# Patient Record
Sex: Female | Born: 1980
Health system: Southern US, Community
[De-identification: ages and names within clinical notes are randomized; demographics above are authoritative.]

## PROBLEM LIST (undated history)

## (undated) DIAGNOSIS — Z789 Other specified health status: Secondary | ICD-10-CM

## (undated) HISTORY — DX: Other specified health status: Z78.9

## (undated) HISTORY — PX: NO PAST SURGERIES: SHX2092

---

## 2015-06-25 LAB — OB RESULTS CONSOLE ABO/RH: RH Type: POSITIVE

## 2015-06-25 LAB — OB RESULTS CONSOLE ANTIBODY SCREEN: Antibody Screen: NEGATIVE

## 2015-06-25 LAB — OB RESULTS CONSOLE HEPATITIS B SURFACE ANTIGEN: HEP B S AG: NEGATIVE

## 2015-06-25 LAB — OB RESULTS CONSOLE HIV ANTIBODY (ROUTINE TESTING): HIV: NONREACTIVE

## 2015-07-16 ENCOUNTER — Other Ambulatory Visit (HOSPITAL_COMMUNITY)
Admission: RE | Admit: 2015-07-16 | Discharge: 2015-07-16 | Disposition: A | Discharge status: Home / Self Care | Payer: Federal, State, Local not specified - PPO | Source: Ambulatory Visit | Attending: Obstetrics & Gynecology | Admitting: Obstetrics & Gynecology

## 2015-07-16 DIAGNOSIS — Z01419 Encounter for gynecological examination (general) (routine) without abnormal findings: Secondary | ICD-10-CM | POA: Diagnosis not present

## 2015-07-16 DIAGNOSIS — Z113 Encounter for screening for infections with a predominantly sexual mode of transmission: Secondary | ICD-10-CM | POA: Diagnosis present

## 2015-07-16 DIAGNOSIS — Z1151 Encounter for screening for human papillomavirus (HPV): Secondary | ICD-10-CM | POA: Diagnosis present

## 2015-07-16 LAB — OB RESULTS CONSOLE RUBELLA ANTIBODY, IGM: RUBELLA: IMMUNE

## 2015-07-16 LAB — OB RESULTS CONSOLE GC/CHLAMYDIA
CHLAMYDIA, DNA PROBE: NEGATIVE
GC PROBE AMP, GENITAL: NEGATIVE

## 2015-11-10 LAB — OB RESULTS CONSOLE RPR: RPR: NONREACTIVE

## 2016-01-13 LAB — OB RESULTS CONSOLE GBS: GBS: POSITIVE

## 2016-01-15 NOTE — H&P (Signed)
HPI: 35 y/o G1P0 @ [redacted]w[redacted]d estimated gestational age who presents for External cephalic version due to breech presentation.  US performed at [redacted]w[redacted]d confirmed frank breech presentation.  She reports no contractions, vaginal bleeding or loss of fluid.  +Fetal movement  ROS: no HA, no epigastric pain, no visual changes.    Pregnancy complicated by: 1) h/o asthma- uses inhaler as needed, denies regular use 2) Breech presentation: [place] @ 362w2d- frank breech (spine maternal left), posterior, EFW: 6#2oz (61%)   Prenatal Transfer Tool  Maternal Diabetes: No Genetic Screening: Normal Maternal Ultrasounds/Referrals: Normal Fetal Ultrasounds or other Referrals:  None Maternal Substance Abuse:  No Significant Maternal Medications:  None Significant Maternal Lab Results: Lab values include: Group B Strep positive   PNL:  GBS POSITIVE, Rub Immune, Hep B neg, RPR NR, HIV neg, GC/C neg, glucola:109 Hgb: 11.5 Blood type: O positive, antibody neg  OBHx: primip PMHx:  asthma Meds:  PNV, albuterol inhaler prn Allergy:  Allergies not on file SurgHx: none SocHx:   no Tobacco, no  EtOH, no Illicit Drugs  O: Examination performed in office on 01/12/15 Gen. AAOx3, NAD Abd. Gravid, non-tender Extr.  no edema B/L , no calf tenderness  FHT:  145bpm  Labs: see orders  A/P:  35 y.o. G1P0 @ [redacted]w[redacted]d EGA who presents for ECV due to breech presentation -FWB:  Reassuring by doppler, plan for continuous monitoring upon arrival  -Plan for bedside ultrasound immediately prior to procedure -Pt to receive Terbutaline 0.2mg  IM prior to start of procedure -Risk/benefit/indications reviewed with patient.  Concerns and questions were addressed and pt wishes to proceed with procedure. -Following procedure will plan on at least 1hr of fetal monitoring.   Myna Hidalgo, DO [phone] (pager) [phone] (office)

## 2016-01-16 ENCOUNTER — Observation Stay (HOSPITAL_COMMUNITY)
Admission: AD | Admit: 2016-01-16 | Discharge: 2016-01-16 | Disposition: A | Discharge status: Home / Self Care | Payer: Federal, State, Local not specified - PPO | Source: Ambulatory Visit | Attending: Obstetrics & Gynecology | Admitting: Obstetrics & Gynecology

## 2016-01-16 ENCOUNTER — Encounter (HOSPITAL_COMMUNITY): Payer: Self-pay | Admitting: *Deleted

## 2016-01-16 DIAGNOSIS — O09513 Supervision of elderly primigravida, third trimester: Secondary | ICD-10-CM | POA: Diagnosis not present

## 2016-01-16 DIAGNOSIS — O321XX0 Maternal care for breech presentation, not applicable or unspecified: Secondary | ICD-10-CM | POA: Diagnosis present

## 2016-01-16 DIAGNOSIS — J45909 Unspecified asthma, uncomplicated: Secondary | ICD-10-CM | POA: Diagnosis not present

## 2016-01-16 DIAGNOSIS — O99513 Diseases of the respiratory system complicating pregnancy, third trimester: Secondary | ICD-10-CM | POA: Insufficient documentation

## 2016-01-16 DIAGNOSIS — Z3A36 36 weeks gestation of pregnancy: Secondary | ICD-10-CM | POA: Diagnosis not present

## 2016-01-16 LAB — ABO/RH: ABO/RH(D): O POS

## 2016-01-16 LAB — CBC
HCT: 38.5 % (ref 36.0–46.0)
Hemoglobin: 13.3 g/dL (ref 12.0–15.0)
MCH: 31.6 pg (ref 26.0–34.0)
MCHC: 34.5 g/dL (ref 30.0–36.0)
MCV: 91.4 fL (ref 78.0–100.0)
PLATELETS: 205 10*3/uL (ref 150–400)
RBC: 4.21 MIL/uL (ref 3.87–5.11)
RDW: 13 % (ref 11.5–15.5)
WBC: 11.6 10*3/uL — AB (ref 4.0–10.5)

## 2016-01-16 LAB — TYPE AND SCREEN
ABO/RH(D): O POS
Antibody Screen: NEGATIVE

## 2016-01-16 MED ORDER — TERBUTALINE SULFATE 1 MG/ML IJ SOLN
INTRAMUSCULAR | Status: AC
  Filled 2016-01-16: qty 1

## 2016-01-16 MED ORDER — TERBUTALINE SULFATE 1 MG/ML IJ SOLN
0.2500 mg | Freq: Once | INTRAMUSCULAR | Status: AC
  Administered 2016-01-16: 0.25 mg via SUBCUTANEOUS

## 2016-01-16 MED ORDER — LACTATED RINGERS IV SOLN
INTRAVENOUS | Status: DC
  Administered 2016-01-16: 11:00:00 via INTRAVENOUS

## 2016-01-16 NOTE — Discharge Instructions (Signed)
°  Labor precautions: Signs and symptoms of labor include:   Menstrual-like cramps, abdominal pain, or back pain.  Uterine contractions that are regular, as frequent as six in an hour, regardless of their intensity (may be mild or painful).  Contractions that start on the top of the uterus and spread down to the lower abdomen and back.   A sense of increased pelvic pressure.   A watery or bloody mucus discharge that comes from the vagina.  WHAT SHOULD YOU DO IF YOU THINK YOU ARE IN LABOR? Call your health care provider right away. You will need to go to the hospital to get checked immediately.

## 2016-01-16 NOTE — Procedures (Signed)
Procedure reviewed and patient consented.  Bedside ultrasound: Homero Fellers breech presentation  Fetal tracings: Category 1   ++++++++++++++++++++++++++++++++++++++++++++++++++++++++++++++++  Procedure:  Medication before procedure: IM Terbutaline given  Number of attempts: 2  Ultrasound during procedure: Yes.    Procedure: After Terbutaline was given and increase in maternal heart rate was noted, a backward roll was attempted.  US performed, fetus remained breech, FHT 120.  A second attempt was performed in the opposite direction with minimal to no movement of fetal hips out of the maternal pelvis.  Result: Failed version  Post-Version fetal tracings: Cat. I  Plan: follow-up in office as scheduled, labor precautions given, fetal kick count reviewed.  Plan for scheduled C-section @ 39wks.  Myna Hidalgo, DO [phone] (pager) [phone] (office)

## 2016-01-28 ENCOUNTER — Inpatient Hospital Stay (HOSPITAL_COMMUNITY)
Admission: AD | Admit: 2016-01-28 | Discharge: 2016-01-31 | DRG: 766 | Disposition: A | Discharge status: Home / Self Care | Payer: Federal, State, Local not specified - PPO | Source: Ambulatory Visit | Attending: Obstetrics & Gynecology | Admitting: Obstetrics & Gynecology

## 2016-01-28 ENCOUNTER — Inpatient Hospital Stay (HOSPITAL_COMMUNITY): Payer: Federal, State, Local not specified - PPO | Admitting: Certified Registered Nurse Anesthetist

## 2016-01-28 ENCOUNTER — Encounter (HOSPITAL_COMMUNITY): Payer: Self-pay | Admitting: *Deleted

## 2016-01-28 ENCOUNTER — Encounter (HOSPITAL_COMMUNITY)
Admission: AD | Disposition: A | Discharge status: Home / Self Care | Payer: Self-pay | Source: Ambulatory Visit | Attending: Obstetrics & Gynecology

## 2016-01-28 DIAGNOSIS — O9952 Diseases of the respiratory system complicating childbirth: Secondary | ICD-10-CM | POA: Diagnosis present

## 2016-01-28 DIAGNOSIS — O99824 Streptococcus B carrier state complicating childbirth: Secondary | ICD-10-CM | POA: Diagnosis present

## 2016-01-28 DIAGNOSIS — O321XX0 Maternal care for breech presentation, not applicable or unspecified: Secondary | ICD-10-CM | POA: Diagnosis present

## 2016-01-28 DIAGNOSIS — Z3A38 38 weeks gestation of pregnancy: Secondary | ICD-10-CM

## 2016-01-28 DIAGNOSIS — Z3490 Encounter for supervision of normal pregnancy, unspecified, unspecified trimester: Secondary | ICD-10-CM

## 2016-01-28 DIAGNOSIS — J45909 Unspecified asthma, uncomplicated: Secondary | ICD-10-CM | POA: Diagnosis present

## 2016-01-28 LAB — TYPE AND SCREEN
ABO/RH(D): O POS
ANTIBODY SCREEN: NEGATIVE

## 2016-01-28 LAB — CBC
HCT: 37.8 % (ref 36.0–46.0)
HEMOGLOBIN: 13.1 g/dL (ref 12.0–15.0)
MCH: 31.3 pg (ref 26.0–34.0)
MCHC: 34.7 g/dL (ref 30.0–36.0)
MCV: 90.2 fL (ref 78.0–100.0)
PLATELETS: 189 10*3/uL (ref 150–400)
RBC: 4.19 MIL/uL (ref 3.87–5.11)
RDW: 13.1 % (ref 11.5–15.5)
WBC: 12.9 10*3/uL — ABNORMAL HIGH (ref 4.0–10.5)

## 2016-01-28 LAB — RPR: RPR: NONREACTIVE

## 2016-01-28 SURGERY — Surgical Case
Anesthesia: Spinal | Site: Abdomen

## 2016-01-28 MED ORDER — SENNOSIDES-DOCUSATE SODIUM 8.6-50 MG PO TABS
2.0000 | ORAL_TABLET | ORAL | Status: DC
  Administered 2016-01-29: 2 via ORAL
  Filled 2016-01-28 (×2): qty 2

## 2016-01-28 MED ORDER — FENTANYL CITRATE (PF) 100 MCG/2ML IJ SOLN
INTRAMUSCULAR | Status: AC
  Filled 2016-01-28: qty 2

## 2016-01-28 MED ORDER — ACETAMINOPHEN 500 MG PO TABS
1000.0000 mg | ORAL_TABLET | Freq: Four times a day (QID) | ORAL | Status: AC
  Administered 2016-01-29: 1000 mg via ORAL
  Filled 2016-01-28 (×2): qty 2

## 2016-01-28 MED ORDER — DIPHENHYDRAMINE HCL 25 MG PO CAPS: 25.0000 mg | ORAL_CAPSULE | Freq: Four times a day (QID) | ORAL | Status: DC | PRN

## 2016-01-28 MED ORDER — ONDANSETRON HCL 4 MG/2ML IJ SOLN
INTRAMUSCULAR | Status: DC | PRN
  Administered 2016-01-28: 4 mg via INTRAVENOUS

## 2016-01-28 MED ORDER — ACETAMINOPHEN 325 MG PO TABS: 650.0000 mg | ORAL_TABLET | ORAL | Status: DC | PRN

## 2016-01-28 MED ORDER — SIMETHICONE 80 MG PO CHEW: 80.0000 mg | CHEWABLE_TABLET | ORAL | Status: DC | PRN

## 2016-01-28 MED ORDER — FAMOTIDINE IN NACL 20-0.9 MG/50ML-% IV SOLN
20.0000 mg | Freq: Once | INTRAVENOUS | Status: AC
  Administered 2016-01-28: 20 mg via INTRAVENOUS

## 2016-01-28 MED ORDER — OXYCODONE-ACETAMINOPHEN 5-325 MG PO TABS: 2.0000 | ORAL_TABLET | ORAL | Status: DC | PRN

## 2016-01-28 MED ORDER — IBUPROFEN 600 MG PO TABS
600.0000 mg | ORAL_TABLET | Freq: Four times a day (QID) | ORAL | Status: DC
  Administered 2016-01-28 – 2016-01-31 (×9): 600 mg via ORAL
  Filled 2016-01-28 (×11): qty 1

## 2016-01-28 MED ORDER — DIPHENHYDRAMINE HCL 50 MG/ML IJ SOLN: 12.5000 mg | INTRAMUSCULAR | Status: DC | PRN

## 2016-01-28 MED ORDER — OXYCODONE-ACETAMINOPHEN 5-325 MG PO TABS
1.0000 | ORAL_TABLET | ORAL | Status: DC | PRN
  Administered 2016-01-29: 1 via ORAL
  Filled 2016-01-28: qty 1

## 2016-01-28 MED ORDER — FENTANYL CITRATE (PF) 100 MCG/2ML IJ SOLN
INTRAMUSCULAR | Status: DC | PRN
  Administered 2016-01-28: 15 ug via EPIDURAL

## 2016-01-28 MED ORDER — SCOPOLAMINE 1 MG/3DAYS TD PT72
MEDICATED_PATCH | TRANSDERMAL | Status: DC | PRN
  Administered 2016-01-28: 1 via TRANSDERMAL

## 2016-01-28 MED ORDER — CITRIC ACID-SODIUM CITRATE 334-500 MG/5ML PO SOLN
ORAL | Status: AC
  Administered 2016-01-28: 30 mL via ORAL
  Filled 2016-01-28: qty 15

## 2016-01-28 MED ORDER — LACTATED RINGERS IV BOLUS (SEPSIS)
1000.0000 mL | Freq: Once | INTRAVENOUS | Status: AC
  Administered 2016-01-28: 1000 mL via INTRAVENOUS

## 2016-01-28 MED ORDER — BUPIVACAINE IN DEXTROSE 0.75-8.25 % IT SOLN
INTRATHECAL | Status: DC | PRN
  Administered 2016-01-28: 1.4 mL via INTRATHECAL
  Administered 2016-01-28: 1 mL via INTRATHECAL

## 2016-01-28 MED ORDER — LANOLIN HYDROUS EX OINT: 1.0000 "application " | TOPICAL_OINTMENT | CUTANEOUS | Status: DC | PRN

## 2016-01-28 MED ORDER — PHENYLEPHRINE 8 MG IN D5W 100 ML (0.08MG/ML) PREMIX OPTIME
INJECTION | INTRAVENOUS | Status: DC | PRN
  Administered 2016-01-28: 60 ug/min via INTRAVENOUS

## 2016-01-28 MED ORDER — MEPERIDINE HCL 25 MG/ML IJ SOLN: 6.2500 mg | INTRAMUSCULAR | Status: DC | PRN

## 2016-01-28 MED ORDER — ONDANSETRON HCL 4 MG/2ML IJ SOLN
INTRAMUSCULAR | Status: AC
  Filled 2016-01-28: qty 2

## 2016-01-28 MED ORDER — MORPHINE SULFATE (PF) 0.5 MG/ML IJ SOLN
INTRAMUSCULAR | Status: DC | PRN
  Administered 2016-01-28: .2 mg via EPIDURAL

## 2016-01-28 MED ORDER — NALOXONE HCL 2 MG/2ML IJ SOSY
1.0000 ug/kg/h | PREFILLED_SYRINGE | INTRAMUSCULAR | Status: DC | PRN
  Filled 2016-01-28: qty 2

## 2016-01-28 MED ORDER — FAMOTIDINE IN NACL 20-0.9 MG/50ML-% IV SOLN
INTRAVENOUS | Status: AC
  Administered 2016-01-28: 20 mg via INTRAVENOUS
  Filled 2016-01-28: qty 50

## 2016-01-28 MED ORDER — WITCH HAZEL-GLYCERIN EX PADS: 1.0000 "application " | MEDICATED_PAD | CUTANEOUS | Status: DC | PRN

## 2016-01-28 MED ORDER — DIBUCAINE 1 % RE OINT: 1.0000 "application " | TOPICAL_OINTMENT | RECTAL | Status: DC | PRN

## 2016-01-28 MED ORDER — LACTATED RINGERS IV SOLN
INTRAVENOUS | Status: DC | PRN
  Administered 2016-01-28: 08:00:00 via INTRAVENOUS

## 2016-01-28 MED ORDER — SODIUM CHLORIDE 0.9% FLUSH: 3.0000 mL | INTRAVENOUS | Status: DC | PRN

## 2016-01-28 MED ORDER — ZOLPIDEM TARTRATE 5 MG PO TABS: 5.0000 mg | ORAL_TABLET | Freq: Every evening | ORAL | Status: DC | PRN

## 2016-01-28 MED ORDER — NALOXONE HCL 0.4 MG/ML IJ SOLN: 0.4000 mg | INTRAMUSCULAR | Status: DC | PRN

## 2016-01-28 MED ORDER — OXYTOCIN 10 UNIT/ML IJ SOLN
INTRAMUSCULAR | Status: AC
  Filled 2016-01-28: qty 4

## 2016-01-28 MED ORDER — DIPHENHYDRAMINE HCL 25 MG PO CAPS: 25.0000 mg | ORAL_CAPSULE | ORAL | Status: DC | PRN

## 2016-01-28 MED ORDER — KETOROLAC TROMETHAMINE 30 MG/ML IJ SOLN: 30.0000 mg | Freq: Once | INTRAMUSCULAR | Status: DC

## 2016-01-28 MED ORDER — IBUPROFEN 600 MG PO TABS: 600.0000 mg | ORAL_TABLET | Freq: Four times a day (QID) | ORAL | Status: DC | PRN

## 2016-01-28 MED ORDER — SCOPOLAMINE 1 MG/3DAYS TD PT72
MEDICATED_PATCH | TRANSDERMAL | Status: AC
  Filled 2016-01-28: qty 1

## 2016-01-28 MED ORDER — NALBUPHINE HCL 10 MG/ML IJ SOLN: 5.0000 mg | Freq: Once | INTRAMUSCULAR | Status: DC | PRN

## 2016-01-28 MED ORDER — CEFAZOLIN SODIUM-DEXTROSE 2-3 GM-% IV SOLR
2.0000 g | INTRAVENOUS | Status: AC
  Administered 2016-01-28: 2 g via INTRAVENOUS
  Filled 2016-01-28: qty 50

## 2016-01-28 MED ORDER — SIMETHICONE 80 MG PO CHEW
80.0000 mg | CHEWABLE_TABLET | Freq: Three times a day (TID) | ORAL | Status: DC
  Administered 2016-01-28 – 2016-01-31 (×7): 80 mg via ORAL
  Filled 2016-01-28 (×7): qty 1

## 2016-01-28 MED ORDER — MENTHOL 3 MG MT LOZG: 1.0000 | LOZENGE | OROMUCOSAL | Status: DC | PRN

## 2016-01-28 MED ORDER — NALBUPHINE HCL 10 MG/ML IJ SOLN: 5.0000 mg | INTRAMUSCULAR | Status: DC | PRN

## 2016-01-28 MED ORDER — KETOROLAC TROMETHAMINE 30 MG/ML IJ SOLN
30.0000 mg | Freq: Four times a day (QID) | INTRAMUSCULAR | Status: DC | PRN
  Administered 2016-01-28: 30 mg via INTRAMUSCULAR

## 2016-01-28 MED ORDER — KETOROLAC TROMETHAMINE 30 MG/ML IJ SOLN
INTRAMUSCULAR | Status: AC
Start: 2016-01-28 — End: 2016-01-28
  Filled 2016-01-28: qty 1

## 2016-01-28 MED ORDER — PHENYLEPHRINE 8 MG IN D5W 100 ML (0.08MG/ML) PREMIX OPTIME
INJECTION | INTRAVENOUS | Status: AC
  Filled 2016-01-28: qty 100

## 2016-01-28 MED ORDER — PROMETHAZINE HCL 25 MG/ML IJ SOLN: 6.2500 mg | INTRAMUSCULAR | Status: DC | PRN

## 2016-01-28 MED ORDER — SCOPOLAMINE 1 MG/3DAYS TD PT72: 1.0000 | MEDICATED_PATCH | Freq: Once | TRANSDERMAL | Status: DC

## 2016-01-28 MED ORDER — OXYTOCIN 10 UNIT/ML IJ SOLN: 2.5000 [IU]/h | INTRAVENOUS | Status: AC

## 2016-01-28 MED ORDER — LACTATED RINGERS IV SOLN
INTRAVENOUS | Status: DC
  Administered 2016-01-28 (×2): via INTRAVENOUS

## 2016-01-28 MED ORDER — CITRIC ACID-SODIUM CITRATE 334-500 MG/5ML PO SOLN
30.0000 mL | Freq: Once | ORAL | Status: AC
  Administered 2016-01-28: 30 mL via ORAL

## 2016-01-28 MED ORDER — SIMETHICONE 80 MG PO CHEW
80.0000 mg | CHEWABLE_TABLET | ORAL | Status: DC
Start: 2016-01-29 — End: 2016-01-31
  Administered 2016-01-29 – 2016-01-31 (×2): 80 mg via ORAL
  Filled 2016-01-28 (×2): qty 1

## 2016-01-28 MED ORDER — KETOROLAC TROMETHAMINE 30 MG/ML IJ SOLN: 30.0000 mg | Freq: Four times a day (QID) | INTRAMUSCULAR | Status: DC | PRN

## 2016-01-28 MED ORDER — ONDANSETRON HCL 4 MG/2ML IJ SOLN: 4.0000 mg | Freq: Three times a day (TID) | INTRAMUSCULAR | Status: DC | PRN

## 2016-01-28 MED ORDER — MORPHINE SULFATE (PF) 0.5 MG/ML IJ SOLN
INTRAMUSCULAR | Status: AC
  Filled 2016-01-28: qty 10

## 2016-01-28 MED ORDER — OXYTOCIN 10 UNIT/ML IJ SOLN
40.0000 [IU] | INTRAVENOUS | Status: DC | PRN
  Administered 2016-01-28: 40 [IU] via INTRAVENOUS

## 2016-01-28 MED ORDER — LACTATED RINGERS IV SOLN
INTRAVENOUS | Status: DC
  Administered 2016-01-28 (×2): via INTRAVENOUS

## 2016-01-28 MED ORDER — PRENATAL MULTIVITAMIN CH
1.0000 | ORAL_TABLET | Freq: Every day | ORAL | Status: DC
  Administered 2016-01-30: 1 via ORAL
  Filled 2016-01-28 (×2): qty 1

## 2016-01-28 SURGICAL SUPPLY — 28 items
BARRIER ADHS 3X4 INTERCEED (GAUZE/BANDAGES/DRESSINGS) ×3 IMPLANT
BENZOIN TINCTURE PRP APPL 2/3 (GAUZE/BANDAGES/DRESSINGS) ×3 IMPLANT
CLAMP CORD UMBIL (MISCELLANEOUS) ×3 IMPLANT
CLOSURE STERI STRIP 1/2 X4 (GAUZE/BANDAGES/DRESSINGS) ×3 IMPLANT
CLOTH BEACON ORANGE TIMEOUT ST (SAFETY) ×3 IMPLANT
DRAPE SHEET LG 3/4 BI-LAMINATE (DRAPES) ×3 IMPLANT
DRSG OPSITE POSTOP 4X10 (GAUZE/BANDAGES/DRESSINGS) ×3 IMPLANT
ELECT REM PT RETURN 9FT ADLT (ELECTROSURGICAL) ×3
ELECTRODE REM PT RTRN 9FT ADLT (ELECTROSURGICAL) ×1 IMPLANT
GLOVE BIOGEL PI IND STRL 6.5 (GLOVE) ×1 IMPLANT
GLOVE BIOGEL PI INDICATOR 6.5 (GLOVE) ×2
GLOVE ECLIPSE 6.5 STRL STRAW (GLOVE) ×3 IMPLANT
GOWN STRL REUS W/TWL LRG LVL3 (GOWN DISPOSABLE) ×9 IMPLANT
NS IRRIG 1000ML POUR BTL (IV SOLUTION) ×3 IMPLANT
PACK C SECTION WH (CUSTOM PROCEDURE TRAY) ×3 IMPLANT
PAD ABD 7.5X8 STRL (GAUZE/BANDAGES/DRESSINGS) ×3 IMPLANT
PAD OB MATERNITY 4.3X12.25 (PERSONAL CARE ITEMS) ×3 IMPLANT
RTRCTR C-SECT PINK 25CM LRG (MISCELLANEOUS) ×3 IMPLANT
SPONGE GAUZE 4X4 12PLY STER LF (GAUZE/BANDAGES/DRESSINGS) ×6 IMPLANT
SUT VIC AB 0 CT1 27 (SUTURE) ×4
SUT VIC AB 0 CT1 27XBRD ANBCTR (SUTURE) ×2 IMPLANT
SUT VIC AB 0 CTX 36 (SUTURE) ×6
SUT VIC AB 0 CTX36XBRD ANBCTRL (SUTURE) ×3 IMPLANT
SUT VIC AB 2-0 CT1 27 (SUTURE) ×2
SUT VIC AB 2-0 CT1 TAPERPNT 27 (SUTURE) ×1 IMPLANT
SUT VIC AB 4-0 KS 27 (SUTURE) ×3 IMPLANT
TOWEL OR 17X24 6PK STRL BLUE (TOWEL DISPOSABLE) ×6 IMPLANT
TRAY FOLEY CATH SILVER 14FR (SET/KITS/TRAYS/PACK) ×3 IMPLANT

## 2016-01-28 NOTE — H&P (Signed)
35 y/o G1P0 @ [redacted]w[redacted]d estimated gestational age who presents for active labor with breech presentation.  Pt states her water broke around 430am and started having more frequent contractions.  No vaginal bleeding. +FM ROS: no HA, no epigastric pain, no visual changes.   Pregnancy complicated by: 1) h/o asthma- uses inhaler as needed, denies regular use 2) Breech presentation: [place] @ [redacted]w[redacted]d- frank breech (spine maternal left), posterior, EFW: 6#2oz (61%)  Prenatal Transfer Tool  Maternal Diabetes: No Genetic Screening: Normal Maternal Ultrasounds/Referrals: Normal Fetal Ultrasounds or other Referrals: None Maternal Substance Abuse: No Significant Maternal Medications: None Significant Maternal Lab Results: Lab values include: Group B Strep positive   PNL: GBS POSITIVE, Rub Immune, Hep B neg, RPR NR, HIV neg, GC/C neg, glucola:109 Hgb: 11.5 Blood type: O positive, antibody neg       OBHx: primip PMHx: asthma Meds: PNV, albuterol inhaler prn Allergy: Allergies not on file SurgHx: none SocHx: no Tobacco, no EtOH, no Illicit Drugs  O: BP 125/82 mmHg  Pulse 77  Temp(Src) 98.2 F (36.8 C) (Oral)  Resp 16  Ht 5' 8.5" (1.74 m)  Wt 77.111 kg (170 lb)  BMI 25.47 kg/m2  SpO2 99%  Gen. AAOx3, NAD Abd. Gravid, non-tender Extr. no edema B/L , no calf tenderness  FHT: 130,  Moderate variability, +accels, no decels Toco: q 3-39min SVE: per RN 6cm, frank breech BSUS: Confirm breech presentation  Labs: see orders  A/P: 35 y.o. G1P0 @ [redacted]w[redacted]d EGA who presents in active labor, plan for primary C-section. -FWB: Cat. I -NPO -LR @ 125cc/hr -Ancef 2g IV to OR -Risk/benefit and indications reviewed with patient- questions and concerns were addressed.  Pt wishes to proceed with C-section.  Inform consent obtained.  Myna Hidalgo, DO [phone] (pager) [phone] (office)

## 2016-01-28 NOTE — Consult Note (Signed)
Neonatology Note:   Attendance at C-section:   I was asked by Dr. Ozan to attend this primary C/S at term due to breech presentation in labor. The mother is a G1P0 O pos, GBS pos with an otherwis uncomplicated pregnancy. ROM 3.5 hours prior to delivery, fluid clear. Infant vigorous with good spontaneous cry and tone. Needed bulb suctioning several times for clear secretions. Ap 8/9. Lungs clear to ausc in DR. To CN to care of Pediatrician.  Yesenia Kelsay C. Yesenia Spielberg, MD 

## 2016-01-28 NOTE — Op Note (Signed)
PreOp Diagnosis: Breech presentation, active labor PostOp Diagnosis: same Procedure: Primary Csection Surgeon: Dr. Myna Hidalgo Assistant: Georges Lynch Anesthesia: spinal Complications: none EBL: 500cc UOP: 100cc Fluids: 2400cc  Findings: Homero Fellers breech presentation, normal uterus, tubes and ovaries bilaterally.  PROCEDURE:  Informed consent was obtained from the patient with risks, benefits, complications, treatment options, and expected outcomes discussed with the patient.  The patient concurred with the proposed plan, giving informed consent with form signed.   The patient was taken to Operating Room, and identified with the procedure verified as C-Section Delivery with Time Out. With induction of anesthesia, the patient was prepped and draped in the usual sterile fashion. A Pfannenstiel incision was made and carried down through the subcutaneous tissue to the fascia. The fascia was incised in the midline and extended transversely. The superior aspect of the fascial incision was grasped with Kochers elevated and the underlying muscle dissected off. The inferior aspect of the facial incision was in similar fashion, grasped elevated and rectus muscles dissected off. The peritoneum was identified and entered. Peritoneal incision was extended longitudinally. The utero-vesical peritoneal reflection was identified and incised transversely with the [facility] scissors, the incision extended laterally, the bladder flap created digitally. A low transverse uterine incision was made and frank breech was noted.  The pelvis was delivered, each leg was then flexed and delivered.  A blue towel was placed over the pelvis, the baby was rotated to allow for delivery of each arm.  The head was flexed and delivered without difficulty.. After the umbilical cord was clamped and cut cord blood was obtained for evaluation.   The placenta was removed intact and appeared normal. The uterine outline, tubes and ovaries appeared  normal. The uterine incision was closed with running locked sutures of 0 Vicryl and a second layer of the same stitch was used in an imbricating fashion.  Excellent hemostasis was obtained.  The pericolic gutters were then cleared of all clots and debris. Interceed was placed over the hysterotomy. The fascia was then reapproximated with running sutures of 0 Vicryl. The peritoneum was closed in a running fashion.  The skin was closed with 4-0 vicryl in a subcuticular fashion.  Instrument, sponge, and needle counts were correct prior the abdominal closure and at the conclusion of the case. The patient was taken to recovery in stable condition.  Myna Hidalgo, DO [phone] (pager) [phone] (office)

## 2016-01-28 NOTE — Lactation Note (Signed)
This note was copied from a baby's chart. Lactation Consultation Note Initial visit at 10 hours of age.  Mom reports a few good feedings but she had needed help getting baby latch.  Mom is sitting in chair recovering from c/s.  Baby is swaddled in FOBs arms asleep.  Mom reports a feeding of 45 minutes with baby falling asleep some.  Encouraged mom to stimulate baby to stay awake during feeding.  LC assisted with instructions on hand expression.  Right nipple has large bruise possible due to compression stripe.  Mom describes a shallow latch.  Discussed latching for a deep latch and mom will call for assist with next feeding.  Questions answered and discussed basics at length.  [facility] LC resources given and discussed.  Encouraged to feed with early cues on demand.  Early newborn behavior discussed.  Hand expression demonstrated by mom  with colostrum visible and rubbed onto nipple.  Mom to call for assist as needed.     Patient Name: Yesenia Haney [id number] Date: 01/28/2016 Reason for consult: Initial assessment   Maternal Data Has patient been taught Hand Expression?: Yes Does the patient have breastfeeding experience prior to this delivery?: No  Feeding Feeding Type: Breast Fed Length of feed: 45 min  LATCH Score/Interventions Latch: Grasps breast easily, tongue down, lips flanged, rhythmical sucking.  Audible Swallowing: A few with stimulation Intervention(s): Skin to skin  Type of Nipple: Everted at rest and after stimulation  Comfort (Breast/Nipple): Soft / non-tender     Hold (Positioning): Assistance needed to correctly position infant at breast and maintain latch. Intervention(s): Breastfeeding basics reviewed;Support Pillows;Skin to skin;Position options  LATCH Score: 8  Lactation Tools Discussed/Used     Consult Status Consult Status: Follow-up Date: 01/29/16 Follow-up type: In-patient    Jannifer Rodney 01/28/2016, 6:03 PM

## 2016-01-28 NOTE — MAU Note (Signed)
Pt reports ROM at 0400, contractions.

## 2016-01-28 NOTE — Addendum Note (Signed)
Addendum  created 01/28/16 1334 by Jhonnie Garner, CRNA   Modules edited: Clinical Notes   Clinical Notes:  File: [id number]

## 2016-01-28 NOTE — Transfer of Care (Signed)
Immediate Anesthesia Transfer of Care Note  Patient: Yesenia Haney  Procedure(s) Performed: Procedure(s): CESAREAN SECTION (N/A)  Patient Location: PACU  Anesthesia Type:Spinal  Level of Consciousness: awake, alert  and oriented  Airway & Oxygen Therapy: Patient Spontanous Breathing  Post-op Assessment: Report given to RN and Post -op Vital signs reviewed and stable  Post vital signs: Reviewed and stable  Last Vitals:  Filed Vitals:   01/28/16 0634  BP: 125/82  Pulse: 77  Temp: 36.8 C  Resp: 16    Complications: No apparent anesthesia complications

## 2016-01-28 NOTE — Anesthesia Procedure Notes (Addendum)
Spinal Patient location during procedure: OR Start time: 01/28/2016 7:25 AM End time: 01/28/2016 7:28 AM Staffing Anesthesiologist: Leilani Able Performed by: anesthesiologist  Preanesthetic Checklist Completed: patient identified, surgical consent, pre-op evaluation, timeout performed, IV checked, risks and benefits discussed and monitors and equipment checked Spinal Block Patient position: sitting Prep: site prepped and draped and DuraPrep Patient monitoring: heart rate, cardiac monitor, continuous pulse ox and blood pressure Approach: midline Location: L3-4 Injection technique: single-shot Needle Needle type: Sprotte  Needle gauge: 24 G Needle length: 9 cm Assessment Sensory level: T12  Spinal Patient location during procedure: OR Start time: 01/28/2016 7:35 AM End time: 01/28/2016 7:40 AM Staffing Anesthesiologist: Leilani Able Performed by: anesthesiologist  Preanesthetic Checklist Completed: patient identified, surgical consent, pre-op evaluation, timeout performed, IV checked, risks and benefits discussed and monitors and equipment checked Spinal Block Patient position: left lateral decubitus Prep: site prepped and draped and DuraPrep Patient monitoring: heart rate, cardiac monitor, continuous pulse ox and blood pressure Approach: midline Location: L3-4 Injection technique: single-shot Needle Needle type: Sprotte  Needle gauge: 24 G Needle length: 9 cm Needle insertion depth: 5 cm Assessment Sensory level: T4 Additional Notes Patient needed second spinal because of inadequate level for surgery especially on the left side.

## 2016-01-28 NOTE — Anesthesia Preprocedure Evaluation (Signed)
Anesthesia Evaluation  Patient identified by MRN, date of birth, ID band Patient awake    Reviewed: Allergy & Precautions, H&P , NPO status , Patient's Chart, lab work & pertinent test results  Airway Mallampati: I  TM Distance: >3 FB Neck ROM: full    Dental no notable dental hx.    Pulmonary neg pulmonary ROS,    Pulmonary exam normal        Cardiovascular negative cardio ROS Normal cardiovascular exam     Neuro/Psych negative neurological ROS  negative psych ROS   GI/Hepatic negative GI ROS, Neg liver ROS,   Endo/Other  negative endocrine ROS  Renal/GU negative Renal ROS     Musculoskeletal   Abdominal Normal abdominal exam  (+)   Peds  Hematology negative hematology ROS (+)   Anesthesia Other Findings   Reproductive/Obstetrics (+) Pregnancy                             Anesthesia Physical Anesthesia Plan  ASA: II  Anesthesia Plan: Spinal   Post-op Pain Management:    Induction:   Airway Management Planned:   Additional Equipment:   Intra-op Plan:   Post-operative Plan:   Informed Consent: I have reviewed the patients History and Physical, chart, labs and discussed the procedure including the risks, benefits and alternatives for the proposed anesthesia with the patient or authorized representative who has indicated his/her understanding and acceptance.     Plan Discussed with: CRNA and Surgeon  Anesthesia Plan Comments:         Anesthesia Quick Evaluation  

## 2016-01-28 NOTE — Lactation Note (Signed)
This note was copied from a baby's chart. Lactation Consultation Note Follow up visit at 12 hours of age.  MBU RN reports mom was tearful and unable to tolerate feeding and no colostrum expressed for collection.  MBU RN gave mom #20NS with improved comfort. Mom reports feeding for about 15 minutes.  Instructed mom on unlatching baby.  No colostrum visible in NS at this time. LC fit mom with #24 with improved fit.  Baby latched an additional 10 minutes with vigorous sucking and some audible swallows, but again no colostrum noted in NS.  LC unable to express on left breast after feeding.  Right nipple is bruised and sore, encourage mom to use hand pump and needed with instructions.  MBU RN at bedside for report and will set up DEBP for mom to post pump for stimulation and to offer supplement of EBM if available.  Mom complains of initial latch on pain, but then about "2" with feeding.  Discussed use of NS and need to attempt without and follow up for weight checks.  MBU RN gave mom information sheet on NS use.  Mom to call for assist as needed.  Mom is very tired with eyes closing during visit and FOB went home but will be back soon.  Encouraged mom to rest when she can.      Patient Name: Yesenia Haney [id number] Date: 01/28/2016 Reason for consult: Follow-up assessment;Breast/nipple pain;Difficult latch   Maternal Data Has patient been taught Hand Expression?: Yes Does the patient have breastfeeding experience prior to this delivery?: No  Feeding Feeding Type: Breast Fed Length of feed:  (observed about 10 minutes)  LATCH Score/Interventions Latch: Grasps breast easily, tongue down, lips flanged, rhythmical sucking. Intervention(s): Adjust position;Assist with latch;Breast massage;Breast compression  Audible Swallowing: None (a few audible swallows no colostrum in NS) Intervention(s): Skin to skin;Hand expression;Alternate breast massage  Type of Nipple: Everted at rest and after  stimulation  Comfort (Breast/Nipple): Filling, red/small blisters or bruises, mild/mod discomfort  Problem noted: Mild/Moderate discomfort;Cracked, bleeding, blisters, bruises Interventions  (Cracked/bleeding/bruising/blister): Hand pump Interventions (Mild/moderate discomfort): Pre-pump if needed;Post-pump  Hold (Positioning): Assistance needed to correctly position infant at breast and maintain latch. Intervention(s): Breastfeeding basics reviewed;Support Pillows;Position options;Skin to skin  LATCH Score: 6  Lactation Tools Discussed/Used Tools: Nipple Shields Nipple shield size: 24   Consult Status Consult Status: Follow-up Date: 01/29/16 Follow-up type: In-patient    Yesenia Haney 01/28/2016, 8:05 PM

## 2016-01-28 NOTE — Anesthesia Postprocedure Evaluation (Signed)
Anesthesia Post Note  Patient: Yesenia Haney  Procedure(s) Performed: Procedure(s) (LRB): CESAREAN SECTION (N/A)  Patient location during evaluation: Mother Baby Anesthesia Type: Spinal Level of consciousness: awake and alert and oriented Pain management: pain level controlled Vital Signs Assessment: post-procedure vital signs reviewed and stable Respiratory status: spontaneous breathing Cardiovascular status: blood pressure returned to baseline Postop Assessment: no headache, no backache, spinal receding, patient able to bend at knees, no signs of nausea or vomiting and adequate PO intake Anesthetic complications: no    Last Vitals:  Filed Vitals:   01/28/16 1128 01/28/16 1230  BP: 114/73 109/69  Pulse: 51 49  Temp: 36.6 C 35.6 C  Resp: 16 16    Last Pain:  Filed Vitals:   01/28/16 1249  PainSc: 1                  Kayvan Hoefling

## 2016-01-28 NOTE — Anesthesia Postprocedure Evaluation (Signed)
Anesthesia Post Note  Patient: Yesenia Haney  Procedure(s) Performed: Procedure(s) (LRB): CESAREAN SECTION (N/A)  Patient location during evaluation: PACU Anesthesia Type: Spinal Level of consciousness: awake Pain management: pain level controlled Vital Signs Assessment: post-procedure vital signs reviewed and stable Respiratory status: spontaneous breathing Cardiovascular status: stable Postop Assessment: no headache, no backache, spinal receding, patient able to bend at knees and no signs of nausea or vomiting Anesthetic complications: no    Last Vitals:  Filed Vitals:   01/28/16 1047 01/28/16 1048  BP:    Pulse: 53 52  Temp:    Resp: 12 12    Last Pain:  Filed Vitals:   01/28/16 1050  PainSc: 1                  Brentley Landfair JR,JOHN Elward Nocera

## 2016-01-29 LAB — CBC
HEMATOCRIT: 33 % — AB (ref 36.0–46.0)
HEMOGLOBIN: 11 g/dL — AB (ref 12.0–15.0)
MCH: 30.7 pg (ref 26.0–34.0)
MCHC: 33.3 g/dL (ref 30.0–36.0)
MCV: 92.2 fL (ref 78.0–100.0)
Platelets: 161 10*3/uL (ref 150–400)
RBC: 3.58 MIL/uL — AB (ref 3.87–5.11)
RDW: 13.4 % (ref 11.5–15.5)
WBC: 13.6 10*3/uL — ABNORMAL HIGH (ref 4.0–10.5)

## 2016-01-29 NOTE — Lactation Note (Signed)
This note was copied from a baby's chart. Lactation Consultation Note: Mother has a crack on the Rt nipple and very red tenderness noted bilaterally.  Infant was circumcised and is still sleeping. . Advised mother to rouse infant with STS every 2-3 hours. . Mother has pumped her breast once yesterday. Mother has been using a #20,#24 nipple shield. Mother describes severe pain with breast feeding. Attempt to latch infant with #20 nipple shield. Infant chomped down on the tip of mothers nipple . She complains of pain scale of #7. Mother taught to firm nipples. Assist with latching infant on the bare breast. Infant latched with a pain scale of #3-4 per mother. Infant sustained latch for 25 mins. Mother states that pain subsided some but still uncomfortable. Father taught to adjust infants lower jaw and upper lip for wider gape. Discussed the use of the DEBP every 2-3 hours to stimulate milk volume . Parents informed of supply and demand. Mother advised to hand express and spoon feed infant any amt that she can. When mentioned the use of formula mother declines. Informed her to pump frequently to have volume to give infant. Mother to continue to use the nipple shield as needed. Discussed taking a break from breastfeeding if nipple become too sore to tolerate feeding. Mother receptive to all teaching.  Mother to page for assistance with feeding from staff nurse or lactation consultant.   Patient Name: Boy Collen Hostler [id number] Date: 01/29/2016 Reason for consult: Follow-up assessment   Maternal Data    Feeding Feeding Type: Breast Fed Length of feed: 25 min  LATCH Score/Interventions Latch: Repeated attempts needed to sustain latch, nipple held in mouth throughout feeding, stimulation needed to elicit sucking reflex.  Audible Swallowing: Spontaneous and intermittent  Type of Nipple: Everted at rest and after stimulation  Comfort (Breast/Nipple): Engorged, cracked, bleeding, large blisters, severe  discomfort Problem noted: Cracked, bleeding, blisters, bruises Intervention(s): Expressed breast milk to nipple;Double electric pump  Problem noted: Severe discomfort Interventions  (Cracked/bleeding/bruising/blister): Expressed breast milk to nipple;Double electric pump Interventions (Mild/moderate discomfort): Comfort gels  Hold (Positioning): Assistance needed to correctly position infant at breast and maintain latch. Intervention(s): Support Pillows;Position options;Skin to skin  LATCH Score: 6  Lactation Tools Discussed/Used     Consult Status Consult Status: Follow-up Date: 01/29/16 Follow-up type: In-patient    Stevan Born [facility] 01/29/2016, 4:20 PM

## 2016-01-29 NOTE — Lactation Note (Signed)
This note was copied from a baby's chart. Lactation Consultation Note RN request to see  Mom d/t extremely sore nipples. RN stated that mom cries every time baby latches, but pain eventually eases as baby BF. Mom has #20 NS and #24 NS. Using #24. Appears to large but #20 d/t soreness of nipple. Patient Name: Yesenia Haney [id number] Date: 01/29/2016 Reason for consult: Follow-up assessment   Maternal Data    Feeding    LATCH Score/Interventions       Type of Nipple: Everted at rest and after stimulation (short shaft)  Comfort (Breast/Nipple): Engorged, cracked, bleeding, large blisters, severe discomfort Problem noted: Cracked, bleeding, blisters, bruises  Problem noted: Cracked, bleeding, blisters, bruises;Severe discomfort Interventions (Mild/moderate discomfort): Comfort gels;Breast shields;Hand massage;Hand expression        Lactation Tools Discussed/Used Tools: Nipple Shields;Comfort gels Nipple shield size: 20;24 Breast pump type: Double-Electric Breast Pump   Consult Status Consult Status: Follow-up Date: 01/29/16 Follow-up type: In-patient    Charyl Dancer 01/29/2016, 5:23 AM

## 2016-01-29 NOTE — Progress Notes (Signed)
Postoperative Note Day # 1  S:  Patient resting comfortable in bed.  Pain controlled.  Tolerating general diet. No flatus, no BM.  Lochia minimal to moderate.  Ambulating without difficulty.  She denies n/v/f/c, SOB, or CP.  Pt plans on breastfeeding.  O: Temp:  [96 F (35.6 C)-98.5 F (36.9 C)] 98 F (36.7 C) (02/16 0542) Pulse Rate:  [47-93] 50 (02/16 0542) Resp:  [11-22] 20 (02/16 0542) BP: (92-114)/(48-74) 94/48 mmHg (02/16 0542) SpO2:  [94 %-100 %] 96 % (02/16 0542) Gen: A&Ox3, NAD CV: RRR, no MRG Resp: CTAB Abdomen: soft, NT, ND +BS Uterus: firm, non-tender, below umbilicus Incision: honeycomb dressing in place, Clean & dry Ext: No edema, no calf tenderness bilaterally, SCDs in place  Labs:  CBC Latest Ref Rng 01/29/2016 01/28/2016 01/16/2016  WBC 4.0 - 10.5 K/uL 13.6(H) 12.9(H) 11.6(H)  Hemoglobin 12.0 - 15.0 g/dL 11.0(L) 13.1 13.3  Hematocrit 36.0 - 46.0 % 33.0(L) 37.8 38.5  Platelets 150 - 400 K/uL 161 189 205     A/P: Pt is a 35 y.o. G1P1001 s/p Primary C-section for breech presentation, POD#1  - Pain well controlled -GU: UOP is adequate, pt to void on her own this am -GI: Tolerating general diet -Activity: encouraged sitting up to chair and ambulation as tolerated -Prophylaxis: early ambulation, SCDs while in bed -Labs: stable as above -Plan for baby boy circ later today  DISPO: Continue with routine postoperative care.  Myna Hidalgo, DO [phone] (pager) [phone] (office)

## 2016-01-30 ENCOUNTER — Encounter (HOSPITAL_COMMUNITY): Payer: Self-pay

## 2016-01-30 MED ORDER — IBUPROFEN 600 MG PO TABS: 600.0000 mg | ORAL_TABLET | Freq: Four times a day (QID) | ORAL | Status: DC | PRN

## 2016-01-30 MED ORDER — OXYCODONE-ACETAMINOPHEN 5-325 MG PO TABS: 1.0000 | ORAL_TABLET | ORAL | Status: DC | PRN

## 2016-01-30 MED ORDER — SENNOSIDES-DOCUSATE SODIUM 8.6-50 MG PO TABS: 2.0000 | ORAL_TABLET | ORAL | Status: AC

## 2016-01-30 NOTE — Progress Notes (Signed)
Postoperative Note Day # 2  S:  Patient resting comfortable in bed.  Pain controlled.  Tolerating general diet. + flatus, Small BM.  Lochia moderate.  Ambulating without difficulty and voiding freely.  She denies n/v/f/c, SOB, or CP.  Pt plans on breastfeeding and has been seen by lactation  O: Temp:  [97.2 F (36.2 C)-98.2 F (36.8 C)] 97.2 F (36.2 C) (02/17 0549) Pulse Rate:  [63-69] 63 (02/17 0549) Resp:  [18-19] 19 (02/17 0549) BP: (103-110)/(66-77) 110/77 mmHg (02/17 0549)   Gen: A&Ox3, NAD CV: RRR, no MRG Resp: CTAB Abdomen: soft, NT, ND +BS Uterus: firm, non-tender, below umbilicus Incision: honeycomb dressing in place, Clean & dry Ext: No edema, no calf tenderness bilaterally  Labs:  CBC Latest Ref Rng 01/29/2016 01/28/2016 01/16/2016  WBC 4.0 - 10.5 K/uL 13.6(H) 12.9(H) 11.6(H)  Hemoglobin 12.0 - 15.0 g/dL 11.0(L) 13.1 13.3  Hematocrit 36.0 - 46.0 % 33.0(L) 37.8 38.5  Platelets 150 - 400 K/uL 161 189 205     A/P: Pt is a 35 y.o. G1P1001 s/p Primary C-section for breech presentation, POD#2  - Pain well controlled -GU: voiding freely -GI: Tolerating general diet -Activity: encouraged sitting up to chair and ambulation as tolerated -Prophylaxis: early ambulation, SCDs while in bed -Labs: stable as above -baby boy circ completed  DISPO: Continue with routine postoperative care. Plan for discharge home tomorrow, 01/31/16.  Myna Hidalgo, DO [phone] (pager) [phone] (office)

## 2016-01-30 NOTE — Lactation Note (Signed)
This note was copied from a baby's chart. Lactation Consultation Note  Mother's nipples are sore, cracked and peeling skin. Hand expressed drops before latching. Latched in cross cradle.  Sucks and swallows observed. Provided mother with shells to wear in addition to alternating w/ comfort gels. Encouraged her to keep baby deep on breast. Discussed pumping if too sore to latch if needed. Reviewed engorgement care and monitoring voids/stools. Discussed cluster feeding and milk transition.   Patient Name: Yesenia Haney ZOXClimmie Cronce17/2018 Reason for consult: Follow-up assessment   Maternal Data    Feeding Feeding Type: Breast Fed  LATCH Score/Interventions Latch: Grasps breast easily, tongue down, lips flanged, rhythmical sucking.  Audible Swallowing: Spontaneous and intermittent  Type of Nipple: Everted at rest and after stimulation  Comfort (Breast/Nipple): Engorged, cracked, bleeding, large blisters, severe discomfort Intervention(s): Expressed breast milk to nipple;Double electric pump  Problem noted: Cracked, bleeding, blisters, bruises Interventions  (Cracked/bleeding/bruising/blister): Expressed breast milk to nipple;Double electric pump;Hand pump Interventions (Mild/moderate discomfort): Comfort gels  Hold (Positioning): Assistance needed to correctly position infant at breast and maintain latch.  LATCH Score: 7  Lactation Tools Discussed/Used Tools: Shells;Comfort gels   Consult Status Consult Status: Follow-up Date: 01/31/16 Follow-up type: In-patient    Dahlia Byes [facility] 01/30/2016, 11:15 AM

## 2016-01-30 NOTE — Discharge Instructions (Signed)
Cesarean Delivery, Care After Refer to this sheet in the next few weeks. These instructions provide you with information on caring for yourself after your procedure. Your health care provider may also give you specific instructions. Your treatment has been planned according to current medical practices, but problems sometimes occur. Call your health care provider if you have any problems or questions after you go home. HOME CARE INSTRUCTIONS  Only take over-the-counter or prescription medications as directed by your health care provider.  For pain medication- you may alternate/rotate between Percocet and Motrin as needed.  Be advised that percocet may cause constipation and so you should continue with senakot-s as needed twice daily.  Do not drink alcohol, especially if you are breastfeeding or taking medication to relieve pain.  Do not chew or smoke tobacco.  Continue to use good perineal care. Good perineal care includes:  Wiping your perineum from front to back.  Keeping your perineum clean.  Check your surgical cut (incision) daily for increased redness, drainage, swelling, or separation of skin.  Clean your incision gently with soap and water every day, and then pat it dry. If your health care provider says it is okay, leave the incision uncovered. Use a bandage (dressing) if the incision is draining fluid or appears irritated. If the adhesive strips across the incision do not fall off within 7 days, carefully peel them off.  Hug a pillow when coughing or sneezing until your incision is healed. This helps to relieve pain.  Do not use tampons or douche until your health care provider says it is okay.  Shower, wash your hair, and take tub baths as directed by your health care provider.  Wear a well-fitting bra that provides breast support.  Limit wearing support panties or control-top hose.  Drink enough fluids to keep your urine clear or pale yellow.  Eat high-fiber foods such as  whole grain cereals and breads, brown rice, beans, and fresh fruits and vegetables every day. These foods may help prevent or relieve constipation.  Resume activities such as climbing stairs, driving, lifting, exercising, or traveling as directed by your health care provider.  Talk to your health care provider about resuming sexual activities. This is dependent upon your risk of infection, your rate of healing, and your comfort and desire to resume sexual activity.  Try to have someone help you with your household activities and your newborn for at least a few days after you leave the hospital.  Rest as much as possible. Try to rest or take a nap when your newborn is sleeping.  Increase your activities gradually.  Keep all of your scheduled postpartum appointments. It is very important to keep your scheduled follow-up appointments. At these appointments, your health care provider will be checking to make sure that you are healing physically and emotionally. SEEK MEDICAL CARE IF:   You are passing large clots from your vagina. Save any clots to show your health care provider.  You have a foul smelling discharge from your vagina.  You have trouble urinating.  You are urinating frequently.  You have pain when you urinate.  You have a change in your bowel movements.  You have increasing redness, pain, or swelling near your incision.  You have pus draining from your incision.  Your incision is separating.  You have painful, hard, or reddened breasts.  You have a severe headache.  You have blurred vision or see spots.  You feel sad or depressed.  You have thoughts of  hurting yourself or your newborn.  You have questions about your care, the care of your newborn, or medications.  You are dizzy or light-headed.  You have a rash.  You have pain, redness, or swelling at the site of the removed intravenous access (IV) tube.  You have nausea or vomiting.  You stopped  breastfeeding and have not had a menstrual period within 12 weeks of stopping.  You are not breastfeeding and have not had a menstrual period within 12 weeks of delivery.  You have a fever. SEEK IMMEDIATE MEDICAL CARE IF:  You have persistent pain.  You have chest pain.  You have shortness of breath.  You faint.  You have leg pain.  You have stomach pain.  Your vaginal bleeding saturates 2 or more sanitary pads in 1 hour. MAKE SURE YOU:   Understand these instructions.  Will watch your condition.  Will get help right away if you are not doing well or get worse.   This information is not intended to replace advice given to you by your health care provider. Make sure you discuss any questions you have with your health care provider.   Document Released: 08/21/2002 Document Revised: 12/20/2014 Document Reviewed: 07/26/2012 Elsevier Interactive Patient Education [employer]

## 2016-01-30 NOTE — Patient Instructions (Signed)
Your procedure is scheduled on:  Tuesday, Feb. 21, 2017  Enter through the Main Entrance of [facility] at:  6:00 A.M.  Pick up the phone at the desk and dial [phone].  Call this number if you have problems the morning of surgery: [phone].  Remember:   Do NOT eat food or drink after:  Midnight tonight  Take these medicines the morning of surgery with a SIP OF WATER:  None  Do NOT wear jewelry (body piercing), metal hair clips/bobby pins, or nail polish. Do NOT wear lotions, powders, or perfumes.  You may wear deoderant. Do NOT shave for 48 hours prior to surgery. Do NOT bring valuables to the hospital.  Leave suitcase in car.  After surgery it may be brought to your room.  For patients admitted to the hospital, checkout time is 11:00 AM the day of discharge.

## 2016-01-31 ENCOUNTER — Ambulatory Visit: Payer: Self-pay

## 2016-01-31 NOTE — Lactation Note (Signed)
This note was copied from a baby's chart. Lactation Consultation Note New mom has severely sore nipples, Rt. Nipple more so than Lt. Breast engorged, Rt.breast more so than Lt. D/t hasn't BF on Rt. D/t pain. Has comfort gels. Nipples are flat at rest, has short shaft, w/pumping everts, when stimulation over, returns flat. Mom has #24 NS, shells, hand pump, and DEBP in rm. Mom isn't wear shells or NS. Applied cabbage to breast and iced. Applied for 20 min. Applied shells. Noted some transitional milk drainage in shell. Massaged Lt. Breast. Applied #24 NS. Positioned baby in football position. Baby has stuffy sounding nose. Baby was aggressive and latch. Mom yells at first, I made sure baby's flange was wide and cheeks to breast. Mom stated that it hurt terrible at first, then got OK. As mom was BF to Lt. Breast, I massaged Rt. Breast and used manual pump. Hand expressed 10ml to soften breast, then pumped 25ml. Baby BF for 30 min.very satisfied after feeding.  FOB laid baby in crib to dress, noted some retracting in abd. Area. Then stopped. Breathing normally after spit up a small amount of milk. Discussed engorgement, prevention, and maintance. Mom has DEBP at home. Discussed postioning, and latching. Explained need to come for a f/u with lactation out patient services. Mom stated she would call for appt. Explained appt. Are probably 1 week out. Reviewed resources. Extensive teaching and teach back done. Breast had softened before I left the room. Mom thankful and discussed plan. Patient Name: Yesenia Haney ZOXWR'U Date: 01/31/2016 Reason for consult: Follow-up assessment;Breast/nipple pain;Difficult latch   Maternal Data    Feeding Feeding Type: Breast Fed Length of feed: 30 min  LATCH Score/Interventions Latch: Grasps breast easily, tongue down, lips flanged, rhythmical sucking. Intervention(s): Adjust position;Assist with latch;Breast massage;Breast compression  Audible Swallowing:  Spontaneous and intermittent Intervention(s): Skin to skin;Hand expression Intervention(s): Alternate breast massage  Type of Nipple: Flat (short shaft, returns flat after stimulation is over) Intervention(s): Shells;Hand pump  Comfort (Breast/Nipple): Engorged, cracked, bleeding, large blisters, severe discomfort Problem noted: Engorgment;Cracked, bleeding, blisters, bruises Intervention(s): Ice;Hand expression;Cabbage leaves Intervention(s): Hand pump  Problem noted: Severe discomfort;Cracked, bleeding, blisters, bruises Interventions  (Cracked/bleeding/bruising/blister): Expressed breast milk to nipple;Hand pump Interventions (Mild/moderate discomfort): Breast shields;Comfort gels;Pre-pump if needed;Post-pump;Hand expression;Hand massage  Hold (Positioning): Assistance needed to correctly position infant at breast and maintain latch. Intervention(s): Skin to skin;Position options;Support Pillows;Breastfeeding basics reviewed  LATCH Score: 6  Lactation Tools Discussed/Used Tools: Shells;Nipple Shields;Pump;Comfort gels Nipple shield size: 24 Shell Type: Inverted Breast pump type: Manual Pump Review: Setup, frequency, and cleaning;Milk Storage Initiated by:: RN Date initiated:: 01/29/16   Consult Status Consult Status: Complete Date: 01/31/16 Follow-up type: Out-patient    Charyl Dancer 01/31/2016, 2:07 PM

## 2016-01-31 NOTE — Discharge Summary (Signed)
OB Discharge Summary  Patient Name: Epifania Littrell DOB: [birth year] MRN: [id number]  Date of admission: 01/28/2016 Delivering MD: Myna Hidalgo   Date of discharge: 01/31/2016  Admitting diagnosis: 38 WEEKS ROM CTX Intrauterine pregnancy: [redacted]w[redacted]d     Secondary diagnosis:Active Problems:   Intrauterine normal pregnancy  Additional problems:none     Discharge diagnosis: Term Pregnancy Delivered                                                                     Post partum procedures:none  Augmentation: none  Complications: None  Hospital course:  Onset of Labor With Unplanned C/S  35 y.o. yo G1P1001 at [redacted]w[redacted]d was admitted in Latent Labor on 01/28/2016. Patient had a labor course significant for breech. Membrane Rupture Time/Date: 4:15 AM ,01/28/2016   The patient went for cesarean section due to Malpresentation, and delivered a Viable infant,01/28/2016  Details of operation can be found in separate operative note. Patient had an uncomplicated postpartum course.  She is ambulating,tolerating a regular diet, passing flatus, and urinating well.  Patient is discharged home in stable condition 01/31/2016.  Physical exam  Filed Vitals:   01/29/16 1822 01/30/16 0549 01/30/16 1808 01/31/16 0700  BP: 103/66 110/77 112/69 113/70  Pulse: 69 63 98 60  Temp: 98.2 F (36.8 C) 97.2 F (36.2 C) 97.7 F (36.5 C) 97.5 F (36.4 C)  TempSrc:  Oral Oral   Resp: 18 19 18 18   Height:      Weight:      SpO2:       General: alert, cooperative and no distress Lochia: appropriate Uterine Fundus: firm Incision: Healing well with no significant drainage DVT Evaluation: No evidence of DVT seen on physical exam. Labs: Lab Results  Component Value Date   WBC 13.6* 01/29/2016   HGB 11.0* 01/29/2016   HCT 33.0* 01/29/2016   MCV 92.2 01/29/2016   PLT 161 01/29/2016   No flowsheet data found.  Discharge instruction: per After Visit Summary and "Baby and Me Booklet".  Medications:  Current  facility-administered medications:  .  acetaminophen (TYLENOL) tablet 650 mg, 650 mg, Oral, Q4H PRN, Myna Hidalgo, DO .  witch hazel-glycerin (TUCKS) pad 1 application, 1 application, Topical, PRN **AND** dibucaine (NUPERCAINAL) 1 % rectal ointment 1 application, 1 application, Rectal, PRN, Myna Hidalgo, DO .  diphenhydrAMINE (BENADRYL) injection 12.5 mg, 12.5 mg, Intravenous, Q4H PRN **OR** diphenhydrAMINE (BENADRYL) capsule 25 mg, 25 mg, Oral, Q4H PRN, Leilani Able, MD .  diphenhydrAMINE (BENADRYL) capsule 25 mg, 25 mg, Oral, Q6H PRN, Myna Hidalgo, DO .  ibuprofen (ADVIL,MOTRIN) tablet 600 mg, 600 mg, Oral, Q6H PRN, Leilani Able, MD .  ibuprofen (ADVIL,MOTRIN) tablet 600 mg, 600 mg, Oral, 4 times per day, Myna Hidalgo, DO, 600 mg at 01/31/16 8657 .  lactated ringers infusion, , Intravenous, Continuous, Myna Hidalgo, DO, Last Rate: 125 mL/hr at 01/28/16 2309 .  lanolin ointment 1 application, 1 application, Topical, PRN, Myna Hidalgo, DO .  menthol-cetylpyridinium (CEPACOL) lozenge 3 mg, 1 lozenge, Oral, Q2H PRN, Myna Hidalgo, DO .  meperidine (DEMEROL) injection 6.25 mg, 6.25 mg, Intravenous, Q5 min PRN, Leilani Able, MD .  meperidine (DEMEROL) injection 6.25-12.5 mg, 6.25-12.5 mg, Intravenous, Q5 min PRN, Leilani Able, MD .  nalbuphine (NUBAIN) injection 5 mg,  5 mg, Intravenous, Q4H PRN **OR** nalbuphine (NUBAIN) injection 5 mg, 5 mg, Subcutaneous, Q4H PRN, Leilani Able, MD .  nalbuphine (NUBAIN) injection 5 mg, 5 mg, Intravenous, Once PRN **OR** nalbuphine (NUBAIN) injection 5 mg, 5 mg, Subcutaneous, Once PRN, Leilani Able, MD .  naloxone (NARCAN) 2 mg in dextrose 5 % 250 mL infusion, 1-4 mcg/kg/hr, Intravenous, Continuous PRN, Leilani Able, MD .  naloxone [facility]) injection 0.4 mg, 0.4 mg, Intravenous, PRN **AND** sodium chloride flush (NS) 0.9 % injection 3 mL, 3 mL, Intravenous, PRN, Leilani Able, MD .  ondansetron (ZOFRAN) injection 4 mg, 4 mg,  Intravenous, Q8H PRN, Leilani Able, MD .  oxyCODONE-acetaminophen (PERCOCET/ROXICET) 5-325 MG per tablet 1 tablet, 1 tablet, Oral, Q4H PRN, Myna Hidalgo, DO, 1 tablet at 01/29/16 1818 .  oxyCODONE-acetaminophen (PERCOCET/ROXICET) 5-325 MG per tablet 2 tablet, 2 tablet, Oral, Q4H PRN, Myna Hidalgo, DO .  prenatal multivitamin tablet 1 tablet, 1 tablet, Oral, Q1200, Myna Hidalgo, DO, 1 tablet at 01/30/16 1200 .  promethazine (PHENERGAN) injection 6.25-12.5 mg, 6.25-12.5 mg, Intravenous, Q15 min PRN, Leilani Able, MD .  senna-docusate (Senokot-S) tablet 2 tablet, 2 tablet, Oral, Q24H, Myna Hidalgo, DO, 2 tablet at 01/29/16 2326 .  simethicone (MYLICON) chewable tablet 80 mg, 80 mg, Oral, TID PC, Jennifer Ozan, DO, 80 mg at 01/31/16 0826 .  simethicone (MYLICON) chewable tablet 80 mg, 80 mg, Oral, Q24H, Jennifer Ozan, DO, 80 mg at 01/31/16 0032 .  simethicone (MYLICON) chewable tablet 80 mg, 80 mg, Oral, PRN, Myna Hidalgo, DO .  zolpidem (AMBIEN) tablet 5 mg, 5 mg, Oral, QHS PRN, Myna Hidalgo, DO After Visit Meds:    Medication List    TAKE these medications        ibuprofen 600 MG tablet  Commonly known as:  ADVIL,MOTRIN  Take 1 tablet (600 mg total) by mouth every 6 (six) hours as needed for mild pain.     oxyCODONE-acetaminophen 5-325 MG tablet  Commonly known as:  PERCOCET/ROXICET  Take 1 tablet by mouth every 4 (four) hours as needed (pain scale 4-7).     prenatal multivitamin Tabs tablet  Take 1 tablet by mouth daily at 12 noon.     senna-docusate 8.6-50 MG tablet  Commonly known as:  Senokot-S  Take 2 tablets by mouth daily. As needed for constipation        Diet: routine diet  Activity: Advance as tolerated. Pelvic rest for 6 weeks.   Outpatient follow up:2 weeks wound check, and 6 week post partum check Follow up Appt:No future appointments. Follow up visit: No Follow-up on file.  Postpartum contraception: Undecided  Newborn Data: Live born female    Birth Weight: 7 lb 15.9 oz (3625 g) APGAR: 8,   Baby Feeding: Breast Disposition:home with mother   01/31/2016 Hever Castilleja, CNM     Care After Cesarean Delivery  Refer to this sheet in the next few weeks. These instructions provide you with information on caring for yourself after your procedure. Your caregiver may also give you specific instructions. Your treatment has been planned according to current medical practices, but problems sometimes occur. Call your caregiver if you have any problems or questions after you go home. HOME CARE INSTRUCTIONS  Only take over-the-counter or prescription medicines as directed by your caregiver.  Do not drink alcohol, especially if you are breastfeeding or taking medicine to relieve pain.  Do not chew or smoke tobacco.  Continue to use good perineal care. Good perineal care includes:  Wiping your perineum  from front to back.  Keeping your perineum clean.  Check your cut (incision) daily for increased redness, drainage, swelling, or separation of skin.  Clean your incision gently with soap and water every day, and then pat it dry. If your caregiver says it is okay, leave the incision uncovered. Use a bandage (dressing) if the incision is draining fluid or appears irritated. If the adhesive strips across the incision do not fall off within 7 days, carefully peel them off.  Hug a pillow when coughing or sneezing until your incision is healed. This helps to relieve pain.  Do not use tampons or douche until your caregiver says it is okay.  Shower, wash your hair, and take tub baths as directed by your caregiver.  Wear a well-fitting bra that provides breast support.  Limit wearing support panties or control-top hose.  Drink enough fluids to keep your urine clear or pale yellow.  Eat high-fiber foods such as whole grain cereals and breads, brown rice, beans, and fresh fruits and vegetables every day. These foods may help prevent or  relieve constipation.  Resume activities such as climbing stairs, driving, lifting, exercising, or traveling as directed by your caregiver.  Talk to your caregiver about resuming sexual activities. This is dependent upon your risk of infection, your rate of healing, and your comfort and desire to resume sexual activity.  Try to have someone help you with your household activities and your newborn for at least a few days after you leave the hospital.  Rest as much as possible. Try to rest or take a nap when your newborn is sleeping.  Increase your activities gradually.  Keep all of your scheduled postpartum appointments. It is very important to keep your scheduled follow-up appointments. At these appointments, your caregiver will be checking to make sure that you are healing physically and emotionally. SEEK MEDICAL CARE IF:   You are passing large clots from your vagina. Save any clots to show your caregiver.  You have a foul smelling discharge from your vagina.  You have trouble urinating.  You are urinating frequently.  You have pain when you urinate.  You have a change in your bowel movements.  You have increasing redness, pain, or swelling near your incision.  You have pus draining from your incision.  Your incision is separating.  You have painful, hard, or reddened breasts.  You have a severe headache.  You have blurred vision or see spots.  You feel sad or depressed.  You have thoughts of hurting yourself or your newborn.  You have questions about your care, the care of your newborn, or medicines.  You are dizzy or lightheaded.  You have a rash.  You have pain, redness, or swelling at the site of the removed intravenous access (IV) tube.  You have nausea or vomiting.  You stopped breastfeeding and have not had a menstrual period within 12 weeks of stopping.  You are not breastfeeding and have not had a menstrual period within 12 weeks of delivery.  You  have a fever. SEEK IMMEDIATE MEDICAL CARE IF:  You have persistent pain.  You have chest pain.  You have shortness of breath.  You faint.  You have leg pain.  You have stomach pain.  Your vaginal bleeding saturates 2 or more sanitary pads in 1 hour. MAKE SURE YOU:   Understand these instructions.  Will watch your condition.  Will get help right away if you are not doing well or get worse. Document  Released: 08/21/2002 Document Revised: 08/23/2012 Document Reviewed: 07/26/2012 [facility] Patient Information 2014 [place], [place].   Postpartum Depression and Baby Blues  The postpartum period begins right after the birth of a baby. During this time, there is often a great amount of joy and excitement. It is also a time of considerable changes in the life of the parent(s). Regardless of how many times a mother gives birth, each child brings new challenges and dynamics to the family. It is not unusual to have feelings of excitement accompanied by confusing shifts in moods, emotions, and thoughts. All mothers are at risk of developing postpartum depression or the "baby blues." These mood changes can occur right after giving birth, or they may occur many months after giving birth. The baby blues or postpartum depression can be mild or severe. Additionally, postpartum depression can resolve rather quickly, or it can be a long-term condition. CAUSES Elevated hormones and their rapid decline are thought to be a main cause of postpartum depression and the baby blues. There are a number of hormones that radically change during and after pregnancy. Estrogen and progesterone usually decrease immediately after delivering your baby. The level of thyroid hormone and various cortisol steroids also rapidly drop. Other factors that play a major role in these changes include major life events and genetics.  RISK FACTORS If you have any of the following risks for the baby blues or postpartum depression, know  what symptoms to watch out for during the postpartum period. Risk factors that may increase the likelihood of getting the baby blues or postpartum depression include: 1. Havinga personal or family history of depression. 2. Having depression while being pregnant. 3. Having premenstrual or oral contraceptive-associated mood issues. 4. Having exceptional life stress. 5. Having marital conflict. 6. Lacking a social support network. 7. Having a baby with special needs. 8. Having health problems such as diabetes. SYMPTOMS Baby blues symptoms include:  Brief fluctuations in mood, such as going from extreme happiness to sadness.  Decreased concentration.  Difficulty sleeping.  Crying spells, tearfulness.  Irritability.  Anxiety. Postpartum depression symptoms typically begin within the first month after giving birth. These symptoms include:  Difficulty sleeping or excessive sleepiness.  Marked weight loss.  Agitation.  Feelings of worthlessness.  Lack of interest in activity or food. Postpartum psychosis is a very concerning condition and can be dangerous. Fortunately, it is rare. Displaying any of the following symptoms is cause for immediate medical attention. Postpartum psychosis symptoms include:  Hallucinations and delusions.  Bizarre or disorganized behavior.  Confusion or disorientation. DIAGNOSIS  A diagnosis is made by an evaluation of your symptoms. There are no medical or lab tests that lead to a diagnosis, but there are various questionnaires that a caregiver may use to identify those with the baby blues, postpartum depression, or psychosis. Often times, a screening tool called the [place] Postnatal Depression Scale is used to diagnose depression in the postpartum period.  TREATMENT The baby blues usually goes away on its own in 1 to 2 weeks. Social support is often all that is needed. You should be encouraged to get adequate sleep and rest. Occasionally, you may be  given medicines to help you sleep.  Postpartum depression requires treatment as it can last several months or longer if it is not treated. Treatment may include individual or group therapy, medicine, or both to address any social, physiological, and psychological factors that may play a role in the depression. Regular exercise, a healthy diet, rest, and social support  may also be strongly recommended.  Postpartum psychosis is more serious and needs treatment right away. Hospitalization is often needed. HOME CARE INSTRUCTIONS  Get as much rest as you can. Nap when the baby sleeps.  Exercise regularly. Some women find yoga and walking to be beneficial.  Eat a balanced and nourishing diet.  Do little things that you enjoy. Have a cup of tea, take a bubble bath, read your favorite magazine, or listen to your favorite music.  Avoid alcohol.  Ask for help with household chores, cooking, grocery shopping, or running errands as needed. Do not try to do everything.  Talk to people close to you about how you are feeling. Get support from your partner, family members, friends, or other new moms.  Try to stay positive in how you think. Think about the things you are grateful for.  Do not spend a lot of time alone.  Only take medicine as directed by your caregiver.  Keep all your postpartum appointments.  Let your caregiver know if you have any concerns. SEEK MEDICAL CARE IF: You are having a reaction or problems with your medicine. SEEK IMMEDIATE MEDICAL CARE IF:  You have suicidal feelings.  You feel you may harm the baby or someone else. Document Released: 09/02/2004 Document Revised: 02/21/2012 Document Reviewed: 10/05/2011 [facility] Patient Information 2014 [place], [place].   Breastfeeding Deciding to breastfeed is one of the best choices you can make for you and your baby. A change in hormones during pregnancy causes your breast tissue to grow and increases the number and size of your  milk ducts. These hormones also allow proteins, sugars, and fats from your blood supply to make breast milk in your milk-producing glands. Hormones prevent breast milk from being released before your baby is born as well as prompt milk flow after birth. Once breastfeeding has begun, thoughts of your baby, as well as his or her sucking or crying, can stimulate the release of milk from your milk-producing glands.  BENEFITS OF BREASTFEEDING For Your Baby  Your first milk (colostrum) helps your baby's digestive system function better.   There are antibodies in your milk that help your baby fight off infections.   Your baby has a lower incidence of asthma, allergies, and sudden infant death syndrome.   The nutrients in breast milk are better for your baby than infant formulas and are designed uniquely for your baby's needs.   Breast milk improves your baby's brain development.   Your baby is less likely to develop other conditions, such as childhood obesity, asthma, or type 2 diabetes mellitus.  For You   Breastfeeding helps to create a very special bond between you and your baby.   Breastfeeding is convenient. Breast milk is always available at the correct temperature and costs nothing.   Breastfeeding helps to burn calories and helps you lose the weight gained during pregnancy.   Breastfeeding makes your uterus contract to its prepregnancy size faster and slows bleeding (lochia) after you give birth.   Breastfeeding helps to lower your risk of developing type 2 diabetes mellitus, osteoporosis, and breast or ovarian cancer later in life. SIGNS THAT YOUR BABY IS HUNGRY Early Signs of Hunger  Increased alertness or activity.  Stretching.  Movement of the head from side to side.  Movement of the head and opening of the mouth when the corner of the mouth or cheek is stroked (rooting).  Increased sucking sounds, smacking lips, cooing, sighing, or squeaking.  Hand-to-mouth  movements.  Increased  sucking of fingers or hands. Late Signs of Hunger  Fussing.  Intermittent crying. Extreme Signs of Hunger Signs of extreme hunger will require calming and consoling before your baby will be able to breastfeed successfully. Do not wait for the following signs of extreme hunger to occur before you initiate breastfeeding:   Restlessness.  A loud, strong cry.   Screaming.  BREASTFEEDING BASICS Breastfeeding Initiation  Find a comfortable place to sit or lie down, with your neck and back well supported.  Place a pillow or rolled up blanket under your baby to bring him or her to the level of your breast (if you are seated). Nursing pillows are specially designed to help support your arms and your baby while you breastfeed.  Make sure that your baby's abdomen is facing your abdomen.   Gently massage your breast. With your fingertips, massage from your chest wall toward your nipple in a circular motion. This encourages milk flow. You may need to continue this action during the feeding if your milk flows slowly.  Support your breast with 4 fingers underneath and your thumb above your nipple. Make sure your fingers are well away from your nipple and your baby's mouth.   Stroke your baby's lips gently with your finger or nipple.   When your baby's mouth is open wide enough, quickly bring your baby to your breast, placing your entire nipple and as much of the colored area around your nipple (areola) as possible into your baby's mouth.   More areola should be visible above your baby's upper lip than below the lower lip.   Your baby's tongue should be between his or her lower gum and your breast.   Ensure that your baby's mouth is correctly positioned around your nipple (latched). Your baby's lips should create a seal on your breast and be turned out (everted).  It is common for your baby to suck about 2-3 minutes in order to start the flow of breast  milk. Latching Teaching your baby how to latch on to your breast properly is very important. An improper latch can cause nipple pain and decreased milk supply for you and poor weight gain in your baby. Also, if your baby is not latched onto your nipple properly, he or she may swallow some air during feeding. This can make your baby fussy. Burping your baby when you switch breasts during the feeding can help to get rid of the air. However, teaching your baby to latch on properly is still the best way to prevent fussiness from swallowing air while breastfeeding. Signs that your baby has successfully latched on to your nipple:    Silent tugging or silent sucking, without causing you pain.   Swallowing heard between every 3-4 sucks.    Muscle movement above and in front of his or her ears while sucking.  Signs that your baby has not successfully latched on to nipple:   Sucking sounds or smacking sounds from your baby while breastfeeding.  Nipple pain. If you think your baby has not latched on correctly, slip your finger into the corner of your baby's mouth to break the suction and place it between your baby's gums. Attempt breastfeeding initiation again. Signs of Successful Breastfeeding Signs from your baby:   A gradual decrease in the number of sucks or complete cessation of sucking.   Falling asleep.   Relaxation of his or her body.   Retention of a small amount of milk in his or her mouth.  Letting go of your breast by himself or herself. Signs from you:  Breasts that have increased in firmness, weight, and size 1-3 hours after feeding.   Breasts that are softer immediately after breastfeeding.  Increased milk volume, as well as a change in milk consistency and color by the fifth day of breastfeeding.   Nipples that are not sore, cracked, or bleeding. Signs That Your Pecola Leisure is Getting Enough Milk  Wetting at least 3 diapers in a 24-hour period. The urine should be  clear and pale yellow by age 66 days.  At least 3 stools in a 24-hour period by age 66 days. The stool should be soft and yellow.  At least 3 stools in a 24-hour period by age 550 days. The stool should be seedy and yellow.  No loss of weight greater than 10% of birth weight during the first 68 days of age.  Average weight gain of 4-7 ounces (113-198 g) per week after age 558 days.  Consistent daily weight gain by age 66 days, without weight loss after the age of 2 weeks. After a feeding, your baby may spit up a small amount. This is common. BREASTFEEDING FREQUENCY AND DURATION Frequent feeding will help you make more milk and can prevent sore nipples and breast engorgement. Breastfeed when you feel the need to reduce the fullness of your breasts or when your baby shows signs of hunger. This is called "breastfeeding on demand." Avoid introducing a pacifier to your baby while you are working to establish breastfeeding (the first 4-6 weeks after your baby is born). After this time you may choose to use a pacifier. Research has shown that pacifier use during the first year of a baby's life decreases the risk of sudden infant death syndrome (SIDS). Allow your baby to feed on each breast as long as he or she wants. Breastfeed until your baby is finished feeding. When your baby unlatches or falls asleep while feeding from the first breast, offer the second breast. Because newborns are often sleepy in the first few weeks of life, you may need to awaken your baby to get him or her to feed. Breastfeeding times will vary from baby to baby. However, the following rules can serve as a guide to help you ensure that your baby is properly fed:  Newborns (babies 25 weeks of age or younger) may breastfeed every 1-3 hours.  Newborns should not go longer than 3 hours during the day or 5 hours during the night without breastfeeding.  You should breastfeed your baby a minimum of 8 times in a 24-hour period until you begin to  introduce solid foods to your baby at around 51 months of age. BREAST MILK PUMPING Pumping and storing breast milk allows you to ensure that your baby is exclusively fed your breast milk, even at times when you are unable to breastfeed. This is especially important if you are going back to work while you are still breastfeeding or when you are not able to be present during feedings. Your lactation consultant can give you guidelines on how long it is safe to store breast milk.  A breast pump is a machine that allows you to pump milk from your breast into a sterile bottle. The pumped breast milk can then be stored in a refrigerator or freezer. Some breast pumps are operated by hand, while others use electricity. Ask your lactation consultant which type will work best for you. Breast pumps can be purchased, but some hospitals and  breastfeeding support groups lease breast pumps on a monthly basis. A lactation consultant can teach you how to hand express breast milk, if you prefer not to use a pump.  CARING FOR YOUR BREASTS WHILE YOU BREASTFEED Nipples can become dry, cracked, and sore while breastfeeding. The following recommendations can help keep your breasts moisturized and healthy:  Avoid using soap on your nipples.   Wear a supportive bra. Although not required, special nursing bras and tank tops are designed to allow access to your breasts for breastfeeding without taking off your entire bra or top. Avoid wearing underwire-style bras or extremely tight bras.  Air dry your nipples for 3-44minutes after each feeding.   Use only cotton bra pads to absorb leaked breast milk. Leaking of breast milk between feedings is normal.   Use lanolin on your nipples after breastfeeding. Lanolin helps to maintain your skin's normal moisture barrier. If you use pure lanolin, you do not need to wash it off before feeding your baby again. Pure lanolin is not toxic to your baby. You may also hand express a few drops  of breast milk and gently massage that milk into your nipples and allow the milk to air dry. In the first few weeks after giving birth, some women experience extremely full breasts (engorgement). Engorgement can make your breasts feel heavy, warm, and tender to the touch. Engorgement peaks within 3-5 days after you give birth. The following recommendations can help ease engorgement:  Completely empty your breasts while breastfeeding or pumping. You may want to start by applying warm, moist heat (in the shower or with warm water-soaked hand towels) just before feeding or pumping. This increases circulation and helps the milk flow. If your baby does not completely empty your breasts while breastfeeding, pump any extra milk after he or she is finished.  Wear a snug bra (nursing or regular) or tank top for 1-2 days to signal your body to slightly decrease milk production.  Apply ice packs to your breasts, unless this is too uncomfortable for you.  Make sure that your baby is latched on and positioned properly while breastfeeding. If engorgement persists after 48 hours of following these recommendations, contact your health care provider or a Advertising copywriter. OVERALL HEALTH CARE RECOMMENDATIONS WHILE BREASTFEEDING  Eat healthy foods. Alternate between meals and snacks, eating 3 of each per day. Because what you eat affects your breast milk, some of the foods may make your baby more irritable than usual. Avoid eating these foods if you are sure that they are negatively affecting your baby.  Drink milk, fruit juice, and water to satisfy your thirst (about 10 glasses a day).   Rest often, relax, and continue to take your prenatal vitamins to prevent fatigue, stress, and anemia.  Continue breast self-awareness checks.  Avoid chewing and smoking tobacco.  Avoid alcohol and drug use. Some medicines that may be harmful to your baby can pass through breast milk. It is important to ask your health  care provider before taking any medicine, including all over-the-counter and prescription medicine as well as vitamin and herbal supplements. It is possible to become pregnant while breastfeeding. If birth control is desired, ask your health care provider about options that will be safe for your baby. SEEK MEDICAL CARE IF:   You feel like you want to stop breastfeeding or have become frustrated with breastfeeding.  You have painful breasts or nipples.  Your nipples are cracked or bleeding.  Your breasts are red, tender, or warm.  You have a swollen area on either breast.  You have a fever or chills.  You have nausea or vomiting.  You have drainage other than breast milk from your nipples.  Your breasts do not become full before feedings by the fifth day after you give birth.  You feel sad and depressed.  Your baby is too sleepy to eat well.  Your baby is having trouble sleeping.   Your baby is wetting less than 3 diapers in a 24-hour period.  Your baby has less than 3 stools in a 24-hour period.  Your baby's skin or the white part of his or her eyes becomes yellow.   Your baby is not gaining weight by 40 days of age. SEEK IMMEDIATE MEDICAL CARE IF:   Your baby is overly tired (lethargic) and does not want to wake up and feed.  Your baby develops an unexplained fever. Document Released: 11/29/2005 Document Revised: 12/04/2013 Document Reviewed: 05/23/2013 [facility] Patient Information 2015 [place], [place]. This information is not intended to replace advice given to you by your health care provider. Make sure you discuss any questions you have with your health care provider.

## 2016-02-02 ENCOUNTER — Inpatient Hospital Stay (HOSPITAL_COMMUNITY)
Admission: RE | Admit: 2016-02-02 | Discharge: 2016-02-02 | Disposition: A | Discharge status: Home / Self Care | Payer: Federal, State, Local not specified - PPO | Source: Ambulatory Visit

## 2016-02-03 ENCOUNTER — Inpatient Hospital Stay (HOSPITAL_COMMUNITY)
Admission: RE | Admit: 2016-02-03 | Payer: Federal, State, Local not specified - PPO | Source: Ambulatory Visit | Admitting: Obstetrics & Gynecology

## 2016-02-03 SURGERY — Surgical Case
Anesthesia: Choice

## 2016-02-04 ENCOUNTER — Encounter (HOSPITAL_COMMUNITY): Payer: Self-pay | Admitting: *Deleted

## 2016-02-11 NOTE — Discharge Summary (Addendum)
Physician Discharge Summary  Patient ID: Yesenia Haney MRN: [id number] DOB/AGE: 02-19-1981 35 y.o.  Admit date: 01/16/2016 Discharge date: 01/16/2016  Admission Diagnoses:  Breech presentation  Discharge Diagnoses:  Active Problems:   * No active hospital problems. *   Discharged Condition: stable  Hospital Course: 35yo G1P0 who presented for scheduled ECV.  Procedure unfortunately was not a success, see procedure note for further information.  FHT remained reassuring and she was discharged home in stable condition with plans for primary C-section.  Consults: None  Significant Diagnostic Studies: none  Treatments: IV hydration, procedure: Attempted External cephalic version  Discharge Exam: Blood pressure 116/57, pulse 78, temperature 97.9 F (36.6 C), temperature source Oral, resp. rate 18, height  (1.753 m), weight 75.297 kg (166 lb), SpO2 100 %, unknown if currently breastfeeding.  GEN: NAD Abd: soft, non-tender FHT: Cat. I reassuring  Disposition: 01-Home or Self Care     Medication List    Notice    You have not been prescribed any medications.         Follow-up Information    Follow up with Myna Hidalgo, M, DO In 1 week.   Specialty:  Obstetrics and Gynecology   Contact information:   [address] [place] [address] [phone]       Signed: Sharon Seller 02/11/2016, 7:40 AM

## 2017-01-19 DIAGNOSIS — F4322 Adjustment disorder with anxiety: Secondary | ICD-10-CM | POA: Diagnosis not present

## 2017-02-16 DIAGNOSIS — F4322 Adjustment disorder with anxiety: Secondary | ICD-10-CM | POA: Diagnosis not present

## 2017-02-22 DIAGNOSIS — Z3041 Encounter for surveillance of contraceptive pills: Secondary | ICD-10-CM | POA: Diagnosis not present

## 2017-03-02 DIAGNOSIS — M9902 Segmental and somatic dysfunction of thoracic region: Secondary | ICD-10-CM | POA: Diagnosis not present

## 2017-03-02 DIAGNOSIS — M9905 Segmental and somatic dysfunction of pelvic region: Secondary | ICD-10-CM | POA: Diagnosis not present

## 2017-03-02 DIAGNOSIS — M25552 Pain in left hip: Secondary | ICD-10-CM | POA: Diagnosis not present

## 2017-03-02 DIAGNOSIS — M9903 Segmental and somatic dysfunction of lumbar region: Secondary | ICD-10-CM | POA: Diagnosis not present

## 2017-03-02 DIAGNOSIS — M546 Pain in thoracic spine: Secondary | ICD-10-CM | POA: Diagnosis not present

## 2017-03-02 DIAGNOSIS — M545 Low back pain: Secondary | ICD-10-CM | POA: Diagnosis not present

## 2017-03-04 DIAGNOSIS — M9902 Segmental and somatic dysfunction of thoracic region: Secondary | ICD-10-CM | POA: Diagnosis not present

## 2017-03-04 DIAGNOSIS — M9903 Segmental and somatic dysfunction of lumbar region: Secondary | ICD-10-CM | POA: Diagnosis not present

## 2017-03-04 DIAGNOSIS — M546 Pain in thoracic spine: Secondary | ICD-10-CM | POA: Diagnosis not present

## 2017-03-04 DIAGNOSIS — M545 Low back pain: Secondary | ICD-10-CM | POA: Diagnosis not present

## 2017-03-08 DIAGNOSIS — M9903 Segmental and somatic dysfunction of lumbar region: Secondary | ICD-10-CM | POA: Diagnosis not present

## 2017-03-08 DIAGNOSIS — M9902 Segmental and somatic dysfunction of thoracic region: Secondary | ICD-10-CM | POA: Diagnosis not present

## 2017-03-08 DIAGNOSIS — M546 Pain in thoracic spine: Secondary | ICD-10-CM | POA: Diagnosis not present

## 2017-03-08 DIAGNOSIS — M545 Low back pain: Secondary | ICD-10-CM | POA: Diagnosis not present

## 2017-03-29 DIAGNOSIS — M9902 Segmental and somatic dysfunction of thoracic region: Secondary | ICD-10-CM | POA: Diagnosis not present

## 2017-03-29 DIAGNOSIS — M9905 Segmental and somatic dysfunction of pelvic region: Secondary | ICD-10-CM | POA: Diagnosis not present

## 2017-03-29 DIAGNOSIS — M546 Pain in thoracic spine: Secondary | ICD-10-CM | POA: Diagnosis not present

## 2017-03-29 DIAGNOSIS — M9903 Segmental and somatic dysfunction of lumbar region: Secondary | ICD-10-CM | POA: Diagnosis not present

## 2017-03-29 DIAGNOSIS — M25552 Pain in left hip: Secondary | ICD-10-CM | POA: Diagnosis not present

## 2017-03-29 DIAGNOSIS — M545 Low back pain: Secondary | ICD-10-CM | POA: Diagnosis not present

## 2017-03-29 DIAGNOSIS — K080 Exfoliation of teeth due to systemic causes: Secondary | ICD-10-CM | POA: Diagnosis not present

## 2017-03-30 DIAGNOSIS — F4322 Adjustment disorder with anxiety: Secondary | ICD-10-CM | POA: Diagnosis not present

## 2017-03-31 DIAGNOSIS — K080 Exfoliation of teeth due to systemic causes: Secondary | ICD-10-CM | POA: Diagnosis not present

## 2017-04-04 DIAGNOSIS — M9902 Segmental and somatic dysfunction of thoracic region: Secondary | ICD-10-CM | POA: Diagnosis not present

## 2017-04-04 DIAGNOSIS — M545 Low back pain: Secondary | ICD-10-CM | POA: Diagnosis not present

## 2017-04-04 DIAGNOSIS — M9903 Segmental and somatic dysfunction of lumbar region: Secondary | ICD-10-CM | POA: Diagnosis not present

## 2017-04-04 DIAGNOSIS — M546 Pain in thoracic spine: Secondary | ICD-10-CM | POA: Diagnosis not present

## 2017-04-13 DIAGNOSIS — M9905 Segmental and somatic dysfunction of pelvic region: Secondary | ICD-10-CM | POA: Diagnosis not present

## 2017-04-13 DIAGNOSIS — M9902 Segmental and somatic dysfunction of thoracic region: Secondary | ICD-10-CM | POA: Diagnosis not present

## 2017-04-13 DIAGNOSIS — M545 Low back pain: Secondary | ICD-10-CM | POA: Diagnosis not present

## 2017-04-13 DIAGNOSIS — M9903 Segmental and somatic dysfunction of lumbar region: Secondary | ICD-10-CM | POA: Diagnosis not present

## 2017-04-13 DIAGNOSIS — M25551 Pain in right hip: Secondary | ICD-10-CM | POA: Diagnosis not present

## 2017-04-13 DIAGNOSIS — M546 Pain in thoracic spine: Secondary | ICD-10-CM | POA: Diagnosis not present

## 2017-04-13 DIAGNOSIS — M9904 Segmental and somatic dysfunction of sacral region: Secondary | ICD-10-CM | POA: Diagnosis not present

## 2017-04-13 DIAGNOSIS — M25552 Pain in left hip: Secondary | ICD-10-CM | POA: Diagnosis not present

## 2017-04-20 DIAGNOSIS — M9901 Segmental and somatic dysfunction of cervical region: Secondary | ICD-10-CM | POA: Diagnosis not present

## 2017-04-20 DIAGNOSIS — M546 Pain in thoracic spine: Secondary | ICD-10-CM | POA: Diagnosis not present

## 2017-04-20 DIAGNOSIS — M9902 Segmental and somatic dysfunction of thoracic region: Secondary | ICD-10-CM | POA: Diagnosis not present

## 2017-04-20 DIAGNOSIS — M531 Cervicobrachial syndrome: Secondary | ICD-10-CM | POA: Diagnosis not present

## 2017-04-27 DIAGNOSIS — M25552 Pain in left hip: Secondary | ICD-10-CM | POA: Diagnosis not present

## 2017-04-27 DIAGNOSIS — M9905 Segmental and somatic dysfunction of pelvic region: Secondary | ICD-10-CM | POA: Diagnosis not present

## 2017-04-27 DIAGNOSIS — M546 Pain in thoracic spine: Secondary | ICD-10-CM | POA: Diagnosis not present

## 2017-04-27 DIAGNOSIS — M25551 Pain in right hip: Secondary | ICD-10-CM | POA: Diagnosis not present

## 2017-04-27 DIAGNOSIS — M545 Low back pain: Secondary | ICD-10-CM | POA: Diagnosis not present

## 2017-04-27 DIAGNOSIS — M9902 Segmental and somatic dysfunction of thoracic region: Secondary | ICD-10-CM | POA: Diagnosis not present

## 2017-04-27 DIAGNOSIS — M9904 Segmental and somatic dysfunction of sacral region: Secondary | ICD-10-CM | POA: Diagnosis not present

## 2017-04-27 DIAGNOSIS — M9903 Segmental and somatic dysfunction of lumbar region: Secondary | ICD-10-CM | POA: Diagnosis not present

## 2017-05-11 DIAGNOSIS — M9901 Segmental and somatic dysfunction of cervical region: Secondary | ICD-10-CM | POA: Diagnosis not present

## 2017-05-11 DIAGNOSIS — M531 Cervicobrachial syndrome: Secondary | ICD-10-CM | POA: Diagnosis not present

## 2017-05-11 DIAGNOSIS — M9903 Segmental and somatic dysfunction of lumbar region: Secondary | ICD-10-CM | POA: Diagnosis not present

## 2017-05-11 DIAGNOSIS — M9902 Segmental and somatic dysfunction of thoracic region: Secondary | ICD-10-CM | POA: Diagnosis not present

## 2017-05-11 DIAGNOSIS — M9905 Segmental and somatic dysfunction of pelvic region: Secondary | ICD-10-CM | POA: Diagnosis not present

## 2017-05-11 DIAGNOSIS — M546 Pain in thoracic spine: Secondary | ICD-10-CM | POA: Diagnosis not present

## 2017-05-18 DIAGNOSIS — M546 Pain in thoracic spine: Secondary | ICD-10-CM | POA: Diagnosis not present

## 2017-05-18 DIAGNOSIS — M9901 Segmental and somatic dysfunction of cervical region: Secondary | ICD-10-CM | POA: Diagnosis not present

## 2017-05-18 DIAGNOSIS — M9902 Segmental and somatic dysfunction of thoracic region: Secondary | ICD-10-CM | POA: Diagnosis not present

## 2017-05-18 DIAGNOSIS — M531 Cervicobrachial syndrome: Secondary | ICD-10-CM | POA: Diagnosis not present

## 2017-05-20 DIAGNOSIS — M25552 Pain in left hip: Secondary | ICD-10-CM | POA: Diagnosis not present

## 2017-05-20 DIAGNOSIS — M546 Pain in thoracic spine: Secondary | ICD-10-CM | POA: Diagnosis not present

## 2017-05-20 DIAGNOSIS — M9902 Segmental and somatic dysfunction of thoracic region: Secondary | ICD-10-CM | POA: Diagnosis not present

## 2017-05-20 DIAGNOSIS — M9905 Segmental and somatic dysfunction of pelvic region: Secondary | ICD-10-CM | POA: Diagnosis not present

## 2017-05-20 DIAGNOSIS — M25551 Pain in right hip: Secondary | ICD-10-CM | POA: Diagnosis not present

## 2017-05-20 DIAGNOSIS — M9901 Segmental and somatic dysfunction of cervical region: Secondary | ICD-10-CM | POA: Diagnosis not present

## 2017-05-20 DIAGNOSIS — M9903 Segmental and somatic dysfunction of lumbar region: Secondary | ICD-10-CM | POA: Diagnosis not present

## 2017-05-20 DIAGNOSIS — M545 Low back pain: Secondary | ICD-10-CM | POA: Diagnosis not present

## 2017-05-25 DIAGNOSIS — K080 Exfoliation of teeth due to systemic causes: Secondary | ICD-10-CM | POA: Diagnosis not present

## 2017-05-27 DIAGNOSIS — M546 Pain in thoracic spine: Secondary | ICD-10-CM | POA: Diagnosis not present

## 2017-05-27 DIAGNOSIS — M531 Cervicobrachial syndrome: Secondary | ICD-10-CM | POA: Diagnosis not present

## 2017-05-27 DIAGNOSIS — M9901 Segmental and somatic dysfunction of cervical region: Secondary | ICD-10-CM | POA: Diagnosis not present

## 2017-05-27 DIAGNOSIS — M9902 Segmental and somatic dysfunction of thoracic region: Secondary | ICD-10-CM | POA: Diagnosis not present

## 2017-05-30 DIAGNOSIS — M546 Pain in thoracic spine: Secondary | ICD-10-CM | POA: Diagnosis not present

## 2017-05-30 DIAGNOSIS — M531 Cervicobrachial syndrome: Secondary | ICD-10-CM | POA: Diagnosis not present

## 2017-05-30 DIAGNOSIS — M9901 Segmental and somatic dysfunction of cervical region: Secondary | ICD-10-CM | POA: Diagnosis not present

## 2017-05-30 DIAGNOSIS — M9902 Segmental and somatic dysfunction of thoracic region: Secondary | ICD-10-CM | POA: Diagnosis not present

## 2017-06-13 DIAGNOSIS — M546 Pain in thoracic spine: Secondary | ICD-10-CM | POA: Diagnosis not present

## 2017-06-13 DIAGNOSIS — M531 Cervicobrachial syndrome: Secondary | ICD-10-CM | POA: Diagnosis not present

## 2017-06-13 DIAGNOSIS — M9902 Segmental and somatic dysfunction of thoracic region: Secondary | ICD-10-CM | POA: Diagnosis not present

## 2017-06-13 DIAGNOSIS — M9901 Segmental and somatic dysfunction of cervical region: Secondary | ICD-10-CM | POA: Diagnosis not present

## 2017-06-20 DIAGNOSIS — M546 Pain in thoracic spine: Secondary | ICD-10-CM | POA: Diagnosis not present

## 2017-06-20 DIAGNOSIS — M9902 Segmental and somatic dysfunction of thoracic region: Secondary | ICD-10-CM | POA: Diagnosis not present

## 2017-06-20 DIAGNOSIS — M9901 Segmental and somatic dysfunction of cervical region: Secondary | ICD-10-CM | POA: Diagnosis not present

## 2017-06-20 DIAGNOSIS — M531 Cervicobrachial syndrome: Secondary | ICD-10-CM | POA: Diagnosis not present

## 2017-06-28 DIAGNOSIS — M531 Cervicobrachial syndrome: Secondary | ICD-10-CM | POA: Diagnosis not present

## 2017-06-28 DIAGNOSIS — M546 Pain in thoracic spine: Secondary | ICD-10-CM | POA: Diagnosis not present

## 2017-06-28 DIAGNOSIS — M9902 Segmental and somatic dysfunction of thoracic region: Secondary | ICD-10-CM | POA: Diagnosis not present

## 2017-06-28 DIAGNOSIS — M9901 Segmental and somatic dysfunction of cervical region: Secondary | ICD-10-CM | POA: Diagnosis not present

## 2017-07-01 DIAGNOSIS — N912 Amenorrhea, unspecified: Secondary | ICD-10-CM | POA: Diagnosis not present

## 2017-07-01 DIAGNOSIS — Z3201 Encounter for pregnancy test, result positive: Secondary | ICD-10-CM | POA: Diagnosis not present

## 2017-07-09 DIAGNOSIS — M9905 Segmental and somatic dysfunction of pelvic region: Secondary | ICD-10-CM | POA: Diagnosis not present

## 2017-07-09 DIAGNOSIS — M9901 Segmental and somatic dysfunction of cervical region: Secondary | ICD-10-CM | POA: Diagnosis not present

## 2017-07-09 DIAGNOSIS — M9903 Segmental and somatic dysfunction of lumbar region: Secondary | ICD-10-CM | POA: Diagnosis not present

## 2017-07-09 DIAGNOSIS — M25552 Pain in left hip: Secondary | ICD-10-CM | POA: Diagnosis not present

## 2017-07-09 DIAGNOSIS — M545 Low back pain: Secondary | ICD-10-CM | POA: Diagnosis not present

## 2017-07-09 DIAGNOSIS — M9902 Segmental and somatic dysfunction of thoracic region: Secondary | ICD-10-CM | POA: Diagnosis not present

## 2017-07-09 DIAGNOSIS — M531 Cervicobrachial syndrome: Secondary | ICD-10-CM | POA: Diagnosis not present

## 2017-07-09 DIAGNOSIS — M546 Pain in thoracic spine: Secondary | ICD-10-CM | POA: Diagnosis not present

## 2017-07-22 DIAGNOSIS — M546 Pain in thoracic spine: Secondary | ICD-10-CM | POA: Diagnosis not present

## 2017-07-22 DIAGNOSIS — M9901 Segmental and somatic dysfunction of cervical region: Secondary | ICD-10-CM | POA: Diagnosis not present

## 2017-07-22 DIAGNOSIS — M25552 Pain in left hip: Secondary | ICD-10-CM | POA: Diagnosis not present

## 2017-07-22 DIAGNOSIS — M9902 Segmental and somatic dysfunction of thoracic region: Secondary | ICD-10-CM | POA: Diagnosis not present

## 2017-07-22 DIAGNOSIS — Z3480 Encounter for supervision of other normal pregnancy, unspecified trimester: Secondary | ICD-10-CM | POA: Diagnosis not present

## 2017-07-22 DIAGNOSIS — M9905 Segmental and somatic dysfunction of pelvic region: Secondary | ICD-10-CM | POA: Diagnosis not present

## 2017-07-22 DIAGNOSIS — M531 Cervicobrachial syndrome: Secondary | ICD-10-CM | POA: Diagnosis not present

## 2017-07-22 DIAGNOSIS — M9903 Segmental and somatic dysfunction of lumbar region: Secondary | ICD-10-CM | POA: Diagnosis not present

## 2017-07-22 DIAGNOSIS — Z3481 Encounter for supervision of other normal pregnancy, first trimester: Secondary | ICD-10-CM | POA: Diagnosis not present

## 2017-07-22 DIAGNOSIS — M545 Low back pain: Secondary | ICD-10-CM | POA: Diagnosis not present

## 2017-07-27 LAB — OB RESULTS CONSOLE GBS: STREP GROUP B AG: POSITIVE

## 2017-08-05 DIAGNOSIS — M9903 Segmental and somatic dysfunction of lumbar region: Secondary | ICD-10-CM | POA: Diagnosis not present

## 2017-08-05 DIAGNOSIS — M9905 Segmental and somatic dysfunction of pelvic region: Secondary | ICD-10-CM | POA: Diagnosis not present

## 2017-08-05 DIAGNOSIS — M25552 Pain in left hip: Secondary | ICD-10-CM | POA: Diagnosis not present

## 2017-08-05 DIAGNOSIS — M546 Pain in thoracic spine: Secondary | ICD-10-CM | POA: Diagnosis not present

## 2017-08-05 DIAGNOSIS — M531 Cervicobrachial syndrome: Secondary | ICD-10-CM | POA: Diagnosis not present

## 2017-08-05 DIAGNOSIS — M545 Low back pain: Secondary | ICD-10-CM | POA: Diagnosis not present

## 2017-08-05 DIAGNOSIS — M9901 Segmental and somatic dysfunction of cervical region: Secondary | ICD-10-CM | POA: Diagnosis not present

## 2017-08-05 DIAGNOSIS — M9902 Segmental and somatic dysfunction of thoracic region: Secondary | ICD-10-CM | POA: Diagnosis not present

## 2017-08-19 DIAGNOSIS — Z3481 Encounter for supervision of other normal pregnancy, first trimester: Secondary | ICD-10-CM | POA: Diagnosis not present

## 2017-08-30 DIAGNOSIS — M9902 Segmental and somatic dysfunction of thoracic region: Secondary | ICD-10-CM | POA: Diagnosis not present

## 2017-08-30 DIAGNOSIS — M9901 Segmental and somatic dysfunction of cervical region: Secondary | ICD-10-CM | POA: Diagnosis not present

## 2017-08-30 DIAGNOSIS — M9905 Segmental and somatic dysfunction of pelvic region: Secondary | ICD-10-CM | POA: Diagnosis not present

## 2017-08-30 DIAGNOSIS — M25552 Pain in left hip: Secondary | ICD-10-CM | POA: Diagnosis not present

## 2017-08-30 DIAGNOSIS — M546 Pain in thoracic spine: Secondary | ICD-10-CM | POA: Diagnosis not present

## 2017-08-30 DIAGNOSIS — M545 Low back pain: Secondary | ICD-10-CM | POA: Diagnosis not present

## 2017-08-30 DIAGNOSIS — M9903 Segmental and somatic dysfunction of lumbar region: Secondary | ICD-10-CM | POA: Diagnosis not present

## 2017-08-30 DIAGNOSIS — M531 Cervicobrachial syndrome: Secondary | ICD-10-CM | POA: Diagnosis not present

## 2017-09-20 DIAGNOSIS — M546 Pain in thoracic spine: Secondary | ICD-10-CM | POA: Diagnosis not present

## 2017-09-20 DIAGNOSIS — M531 Cervicobrachial syndrome: Secondary | ICD-10-CM | POA: Diagnosis not present

## 2017-09-20 DIAGNOSIS — M9901 Segmental and somatic dysfunction of cervical region: Secondary | ICD-10-CM | POA: Diagnosis not present

## 2017-09-20 DIAGNOSIS — M9902 Segmental and somatic dysfunction of thoracic region: Secondary | ICD-10-CM | POA: Diagnosis not present

## 2017-09-23 DIAGNOSIS — O09512 Supervision of elderly primigravida, second trimester: Secondary | ICD-10-CM | POA: Diagnosis not present

## 2017-09-27 DIAGNOSIS — Z0000 Encounter for general adult medical examination without abnormal findings: Secondary | ICD-10-CM | POA: Diagnosis not present

## 2017-10-11 DIAGNOSIS — M546 Pain in thoracic spine: Secondary | ICD-10-CM | POA: Diagnosis not present

## 2017-10-11 DIAGNOSIS — M531 Cervicobrachial syndrome: Secondary | ICD-10-CM | POA: Diagnosis not present

## 2017-10-11 DIAGNOSIS — M9902 Segmental and somatic dysfunction of thoracic region: Secondary | ICD-10-CM | POA: Diagnosis not present

## 2017-10-11 DIAGNOSIS — M9901 Segmental and somatic dysfunction of cervical region: Secondary | ICD-10-CM | POA: Diagnosis not present

## 2017-10-13 DIAGNOSIS — Z36 Encounter for antenatal screening for chromosomal anomalies: Secondary | ICD-10-CM | POA: Diagnosis not present

## 2017-10-13 DIAGNOSIS — Z3482 Encounter for supervision of other normal pregnancy, second trimester: Secondary | ICD-10-CM | POA: Diagnosis not present

## 2017-10-17 DIAGNOSIS — K080 Exfoliation of teeth due to systemic causes: Secondary | ICD-10-CM | POA: Diagnosis not present

## 2017-11-09 DIAGNOSIS — M9903 Segmental and somatic dysfunction of lumbar region: Secondary | ICD-10-CM | POA: Diagnosis not present

## 2017-11-09 DIAGNOSIS — M9905 Segmental and somatic dysfunction of pelvic region: Secondary | ICD-10-CM | POA: Diagnosis not present

## 2017-11-09 DIAGNOSIS — M546 Pain in thoracic spine: Secondary | ICD-10-CM | POA: Diagnosis not present

## 2017-11-09 DIAGNOSIS — M9901 Segmental and somatic dysfunction of cervical region: Secondary | ICD-10-CM | POA: Diagnosis not present

## 2017-11-09 DIAGNOSIS — M25552 Pain in left hip: Secondary | ICD-10-CM | POA: Diagnosis not present

## 2017-11-09 DIAGNOSIS — M9902 Segmental and somatic dysfunction of thoracic region: Secondary | ICD-10-CM | POA: Diagnosis not present

## 2017-11-09 DIAGNOSIS — M531 Cervicobrachial syndrome: Secondary | ICD-10-CM | POA: Diagnosis not present

## 2017-11-09 DIAGNOSIS — M545 Low back pain: Secondary | ICD-10-CM | POA: Diagnosis not present

## 2017-11-30 DIAGNOSIS — M531 Cervicobrachial syndrome: Secondary | ICD-10-CM | POA: Diagnosis not present

## 2017-11-30 DIAGNOSIS — M9905 Segmental and somatic dysfunction of pelvic region: Secondary | ICD-10-CM | POA: Diagnosis not present

## 2017-11-30 DIAGNOSIS — M9901 Segmental and somatic dysfunction of cervical region: Secondary | ICD-10-CM | POA: Diagnosis not present

## 2017-11-30 DIAGNOSIS — M546 Pain in thoracic spine: Secondary | ICD-10-CM | POA: Diagnosis not present

## 2017-11-30 DIAGNOSIS — M25552 Pain in left hip: Secondary | ICD-10-CM | POA: Diagnosis not present

## 2017-11-30 DIAGNOSIS — M545 Low back pain: Secondary | ICD-10-CM | POA: Diagnosis not present

## 2017-11-30 DIAGNOSIS — M9903 Segmental and somatic dysfunction of lumbar region: Secondary | ICD-10-CM | POA: Diagnosis not present

## 2017-11-30 DIAGNOSIS — M9902 Segmental and somatic dysfunction of thoracic region: Secondary | ICD-10-CM | POA: Diagnosis not present

## 2017-12-01 DIAGNOSIS — O09522 Supervision of elderly multigravida, second trimester: Secondary | ICD-10-CM | POA: Diagnosis not present

## 2017-12-13 NOTE — L&D Delivery Note (Signed)
Delivery Note At  a viable female was delivered via  (Presentation:vtx ;LOA  ).  APGAR:8 9, ; weight  .   Placenta status:complete , .3V  Cord:  with the following complications:None .    Anesthesia:  1% lidocaine Episiotomy:  None Lacerations:  bilateral periurethral, 1st vaginal  Suture Repair: 4-0 vicryl, 2-0 vicryl Est. Blood Loss (mL):    Mom to postpartum.  Baby to Couplet care / Skin to Skin.  Yesenia Haney 02/24/2018, 3:24 AM

## 2017-12-27 DIAGNOSIS — M9902 Segmental and somatic dysfunction of thoracic region: Secondary | ICD-10-CM | POA: Diagnosis not present

## 2017-12-27 DIAGNOSIS — M531 Cervicobrachial syndrome: Secondary | ICD-10-CM | POA: Diagnosis not present

## 2017-12-27 DIAGNOSIS — M546 Pain in thoracic spine: Secondary | ICD-10-CM | POA: Diagnosis not present

## 2017-12-27 DIAGNOSIS — M9901 Segmental and somatic dysfunction of cervical region: Secondary | ICD-10-CM | POA: Diagnosis not present

## 2017-12-29 DIAGNOSIS — Z23 Encounter for immunization: Secondary | ICD-10-CM | POA: Diagnosis not present

## 2018-01-05 DIAGNOSIS — O26843 Uterine size-date discrepancy, third trimester: Secondary | ICD-10-CM | POA: Diagnosis not present

## 2018-01-05 DIAGNOSIS — Z3A31 31 weeks gestation of pregnancy: Secondary | ICD-10-CM | POA: Diagnosis not present

## 2018-01-05 DIAGNOSIS — O09523 Supervision of elderly multigravida, third trimester: Secondary | ICD-10-CM | POA: Diagnosis not present

## 2018-01-18 DIAGNOSIS — M9902 Segmental and somatic dysfunction of thoracic region: Secondary | ICD-10-CM | POA: Diagnosis not present

## 2018-01-18 DIAGNOSIS — M9903 Segmental and somatic dysfunction of lumbar region: Secondary | ICD-10-CM | POA: Diagnosis not present

## 2018-01-18 DIAGNOSIS — M546 Pain in thoracic spine: Secondary | ICD-10-CM | POA: Diagnosis not present

## 2018-01-18 DIAGNOSIS — M545 Low back pain: Secondary | ICD-10-CM | POA: Diagnosis not present

## 2018-01-18 DIAGNOSIS — M25552 Pain in left hip: Secondary | ICD-10-CM | POA: Diagnosis not present

## 2018-01-18 DIAGNOSIS — M9905 Segmental and somatic dysfunction of pelvic region: Secondary | ICD-10-CM | POA: Diagnosis not present

## 2018-01-18 DIAGNOSIS — M531 Cervicobrachial syndrome: Secondary | ICD-10-CM | POA: Diagnosis not present

## 2018-01-18 DIAGNOSIS — M9901 Segmental and somatic dysfunction of cervical region: Secondary | ICD-10-CM | POA: Diagnosis not present

## 2018-02-07 DIAGNOSIS — M531 Cervicobrachial syndrome: Secondary | ICD-10-CM | POA: Diagnosis not present

## 2018-02-07 DIAGNOSIS — M9901 Segmental and somatic dysfunction of cervical region: Secondary | ICD-10-CM | POA: Diagnosis not present

## 2018-02-07 DIAGNOSIS — M9902 Segmental and somatic dysfunction of thoracic region: Secondary | ICD-10-CM | POA: Diagnosis not present

## 2018-02-07 DIAGNOSIS — M546 Pain in thoracic spine: Secondary | ICD-10-CM | POA: Diagnosis not present

## 2018-02-20 DIAGNOSIS — O26843 Uterine size-date discrepancy, third trimester: Secondary | ICD-10-CM | POA: Diagnosis not present

## 2018-02-20 DIAGNOSIS — O09523 Supervision of elderly multigravida, third trimester: Secondary | ICD-10-CM | POA: Diagnosis not present

## 2018-02-21 DIAGNOSIS — M546 Pain in thoracic spine: Secondary | ICD-10-CM | POA: Diagnosis not present

## 2018-02-21 DIAGNOSIS — M9905 Segmental and somatic dysfunction of pelvic region: Secondary | ICD-10-CM | POA: Diagnosis not present

## 2018-02-21 DIAGNOSIS — M9901 Segmental and somatic dysfunction of cervical region: Secondary | ICD-10-CM | POA: Diagnosis not present

## 2018-02-21 DIAGNOSIS — M9903 Segmental and somatic dysfunction of lumbar region: Secondary | ICD-10-CM | POA: Diagnosis not present

## 2018-02-21 DIAGNOSIS — M545 Low back pain: Secondary | ICD-10-CM | POA: Diagnosis not present

## 2018-02-21 DIAGNOSIS — M9902 Segmental and somatic dysfunction of thoracic region: Secondary | ICD-10-CM | POA: Diagnosis not present

## 2018-02-21 DIAGNOSIS — M531 Cervicobrachial syndrome: Secondary | ICD-10-CM | POA: Diagnosis not present

## 2018-02-21 DIAGNOSIS — M25552 Pain in left hip: Secondary | ICD-10-CM | POA: Diagnosis not present

## 2018-02-23 ENCOUNTER — Inpatient Hospital Stay (HOSPITAL_COMMUNITY)
Admission: AD | Admit: 2018-02-23 | Discharge: 2018-02-25 | DRG: 807 | Disposition: A | Discharge status: Home / Self Care | Payer: Federal, State, Local not specified - PPO | Source: Ambulatory Visit | Attending: Obstetrics and Gynecology | Admitting: Obstetrics and Gynecology

## 2018-02-23 ENCOUNTER — Encounter (HOSPITAL_COMMUNITY): Payer: Self-pay

## 2018-02-23 DIAGNOSIS — Z3483 Encounter for supervision of other normal pregnancy, third trimester: Secondary | ICD-10-CM | POA: Diagnosis present

## 2018-02-23 DIAGNOSIS — O34219 Maternal care for unspecified type scar from previous cesarean delivery: Secondary | ICD-10-CM | POA: Diagnosis not present

## 2018-02-23 DIAGNOSIS — Z98891 History of uterine scar from previous surgery: Secondary | ICD-10-CM | POA: Diagnosis not present

## 2018-02-23 DIAGNOSIS — Z3A38 38 weeks gestation of pregnancy: Secondary | ICD-10-CM | POA: Diagnosis not present

## 2018-02-23 DIAGNOSIS — O99824 Streptococcus B carrier state complicating childbirth: Principal | ICD-10-CM | POA: Diagnosis present

## 2018-02-23 DIAGNOSIS — Z23 Encounter for immunization: Secondary | ICD-10-CM | POA: Diagnosis not present

## 2018-02-23 LAB — CBC
HCT: 36.4 % (ref 36.0–46.0)
HEMOGLOBIN: 12.2 g/dL (ref 12.0–15.0)
MCH: 30.7 pg (ref 26.0–34.0)
MCHC: 33.5 g/dL (ref 30.0–36.0)
MCV: 91.5 fL (ref 78.0–100.0)
PLATELETS: 208 10*3/uL (ref 150–400)
RBC: 3.98 MIL/uL (ref 3.87–5.11)
RDW: 12.8 % (ref 11.5–15.5)
WBC: 15.3 10*3/uL — AB (ref 4.0–10.5)

## 2018-02-23 LAB — TYPE AND SCREEN
ABO/RH(D): O POS
Antibody Screen: NEGATIVE

## 2018-02-23 MED ORDER — SOD CITRATE-CITRIC ACID 500-334 MG/5ML PO SOLN: 30.0000 mL | ORAL | Status: DC | PRN

## 2018-02-23 MED ORDER — LACTATED RINGERS IV SOLN: 500.0000 mL | INTRAVENOUS | Status: DC | PRN

## 2018-02-23 MED ORDER — OXYTOCIN 40 UNITS IN LACTATED RINGERS INFUSION - SIMPLE MED
2.5000 [IU]/h | INTRAVENOUS | Status: DC
  Administered 2018-02-24: 2.5 [IU]/h via INTRAVENOUS
  Filled 2018-02-23: qty 1000

## 2018-02-23 MED ORDER — ONDANSETRON HCL 4 MG/2ML IJ SOLN: 4.0000 mg | Freq: Four times a day (QID) | INTRAMUSCULAR | Status: DC | PRN

## 2018-02-23 MED ORDER — LACTATED RINGERS IV BOLUS (SEPSIS)
1000.0000 mL | Freq: Once | INTRAVENOUS | Status: AC
  Administered 2018-02-23: 1000 mL via INTRAVENOUS

## 2018-02-23 MED ORDER — LACTATED RINGERS IV SOLN
INTRAVENOUS | Status: DC
Start: 2018-02-23 — End: 2018-02-25

## 2018-02-23 MED ORDER — OXYCODONE-ACETAMINOPHEN 5-325 MG PO TABS: 1.0000 | ORAL_TABLET | ORAL | Status: DC | PRN

## 2018-02-23 MED ORDER — OXYTOCIN BOLUS FROM INFUSION
500.0000 mL | Freq: Once | INTRAVENOUS | Status: AC
  Administered 2018-02-24: 500 mL via INTRAVENOUS

## 2018-02-23 MED ORDER — SODIUM CHLORIDE 0.9 % IV SOLN
5.0000 10*6.[IU] | Freq: Once | INTRAVENOUS | Status: AC
  Administered 2018-02-23: 5 10*6.[IU] via INTRAVENOUS
  Filled 2018-02-23: qty 5

## 2018-02-23 MED ORDER — LIDOCAINE HCL (PF) 1 % IJ SOLN
30.0000 mL | INTRAMUSCULAR | Status: DC | PRN
  Administered 2018-02-24: 30 mL via SUBCUTANEOUS
  Filled 2018-02-23: qty 30

## 2018-02-23 MED ORDER — PENICILLIN G POT IN DEXTROSE 60000 UNIT/ML IV SOLN
3.0000 10*6.[IU] | INTRAVENOUS | Status: DC
  Administered 2018-02-24: 3 10*6.[IU] via INTRAVENOUS
  Filled 2018-02-23 (×4): qty 50

## 2018-02-23 MED ORDER — ACETAMINOPHEN 325 MG PO TABS: 650.0000 mg | ORAL_TABLET | ORAL | Status: DC | PRN

## 2018-02-23 MED ORDER — LACTATED RINGERS IV SOLN: INTRAVENOUS | Status: DC

## 2018-02-23 MED ORDER — OXYCODONE-ACETAMINOPHEN 5-325 MG PO TABS: 2.0000 | ORAL_TABLET | ORAL | Status: DC | PRN

## 2018-02-23 NOTE — Progress Notes (Signed)
Subjective: Breathing with contractions. Husband in room.  Objective: BP 125/71   Pulse 74   Temp 98 F (36.7 C) (Oral)   Resp 20   Ht 5\' 9"  (1.753 m)   Wt 77.6 kg (171 lb)   SpO2 100%   BMI 25.25 kg/m  No intake/output data recorded. No intake/output data recorded.  FHT: Category 1 UC:   regular, every 3 in 10 minutes SVE:   Dilation: 8 Effacement (%): 80 Station: 0 Exam by:: Harriett SineNancy, CNM Assessment:  37 yo G2P1001 tolac in active labor Cat 1 strip  Plan: Anticipate SVD  Kenney HousemanNancy Jean Timya Trimmer CNM, MSN 02/23/2018, 11:13 PM

## 2018-02-23 NOTE — H&P (Signed)
Yesenia Haney is a 37 y.o. female, G2P1001 at 38.3 weeks, presenting for labor.  Denies leakage of fluid.  FM +.  Previous hx of LTCS for breech desires tolac.  Consent obtained in office.  GBS positive in urine.  Patient Active Problem List   Diagnosis Date Noted  . Previous cesarean section complicating pregnancy 02/23/2018  . Normal labor 02/23/2018  . Intrauterine normal pregnancy 01/28/2016    History of present pregnancy: Patient entered care at <12 weeks.   EDC of 03/06/2018 was established by LMP.   Anatomy scan:  20 weeks, with normal findings and an pos placenta.   Additional US evaluations:  Significant prenatal events: None  Last evaluation:  Last week  OB History    Gravida Para Term Preterm AB Living   2 1 1     1    SAB TAB Ectopic Multiple Live Births         0 1     History reviewed. No pertinent past medical history. Past Surgical History:  Procedure Laterality Date  . CESAREAN SECTION N/A 01/28/2016   Procedure: CESAREAN SECTION;  Surgeon: Myna HidalgoJennifer Ozan, DO;  Location: [facility];  Service: Obstetrics;  Laterality: N/A;  . NO PAST SURGERIES     Family History: family history is not on file. Social History:  reports that  has never smoked. she has never used smokeless tobacco. She reports that she drinks alcohol. She reports that she does not use drugs.   Prenatal Transfer Tool  Maternal Diabetes: No Genetic Screening: Normal Maternal Ultrasounds/Referrals: Normal Fetal Ultrasounds or other Referrals:  None Maternal Substance Abuse:  No Significant Maternal Medications:  None Significant Maternal Lab Results: None  TDAP Yes Flu Declined  ROS:  All 10 systems reviewed and negative except as stated above.  Allergies  Allergen Reactions  . Bee Venom Itching, Nausea And Vomiting and Swelling    Flu-like symptoms     Dilation: 6.5 Effacement (%): 80 Station: -1 Exam by:: lauren fields rn Blood pressure 104/82, pulse 75, temperature 98.1 F (36.7  C), temperature source Oral, resp. rate 20, height 5\' 9"  (1.753 m), weight 77.6 kg (171 lb), SpO2 100 %, unknown if currently breastfeeding.  Chest clear Heart RRR without murmur Abd gravid, NT, FH appropriate for gestational age Pelvic: per RN Ext: +2/+2 Neg edema  FHR: Category 1 UCs:  3 in 10 minutes  Prenatal labs: ABO, Rh: --/--/O POS (03/14 2015) Antibody: NEG (03/14 2015) Rubella:   Immune RPR:   NR HBsAg:   Neg HIV:   NR GBS: Positive (08/15 0000)  Pap:   GC:  Neg Chlamydia:  Neg Genetic screenings:  Neg Glucola:  84 Other:   Hgb  11.5 at 28 weeks       Assessment/Plan: IUP at 38.3 in active labor Cat 1 strip TOLAC GBS positive  Plan: Admit to Birthing Suite  Routine [facility] orders Pain med/epidural prn, Pt desires NCB PCN G for GBS prophylaxis PCN  Henderson NewcomerNancy Jean ProtheroCNM, MSN 02/23/2018, 10:07 PM

## 2018-02-23 NOTE — MAU Note (Signed)
CTX all day-have gotten worse in the past few hours, now 4-5 minutes apart.  Reports good FM. No LOF/VB.  No VE.

## 2018-02-24 ENCOUNTER — Other Ambulatory Visit: Payer: Self-pay

## 2018-02-24 ENCOUNTER — Encounter (HOSPITAL_COMMUNITY): Payer: Self-pay

## 2018-02-24 DIAGNOSIS — O34219 Maternal care for unspecified type scar from previous cesarean delivery: Secondary | ICD-10-CM

## 2018-02-24 LAB — RPR: RPR Ser Ql: NONREACTIVE

## 2018-02-24 MED ORDER — WITCH HAZEL-GLYCERIN EX PADS: 1.0000 "application " | MEDICATED_PAD | CUTANEOUS | Status: DC | PRN

## 2018-02-24 MED ORDER — ACETAMINOPHEN 325 MG PO TABS: 650.0000 mg | ORAL_TABLET | ORAL | Status: DC | PRN

## 2018-02-24 MED ORDER — ONDANSETRON HCL 4 MG PO TABS: 4.0000 mg | ORAL_TABLET | ORAL | Status: DC | PRN

## 2018-02-24 MED ORDER — COCONUT OIL OIL: 1.0000 "application " | TOPICAL_OIL | Status: DC | PRN

## 2018-02-24 MED ORDER — PRENATAL MULTIVITAMIN CH
1.0000 | ORAL_TABLET | Freq: Every day | ORAL | Status: DC
  Administered 2018-02-24 – 2018-02-25 (×2): 1 via ORAL
  Filled 2018-02-24 (×2): qty 1

## 2018-02-24 MED ORDER — TETANUS-DIPHTH-ACELL PERTUSSIS 5-2.5-18.5 LF-MCG/0.5 IM SUSP: 0.5000 mL | Freq: Once | INTRAMUSCULAR | Status: DC

## 2018-02-24 MED ORDER — ZOLPIDEM TARTRATE 5 MG PO TABS
5.0000 mg | ORAL_TABLET | Freq: Every evening | ORAL | Status: DC | PRN
Start: 2018-02-24 — End: 2018-02-25

## 2018-02-24 MED ORDER — SIMETHICONE 80 MG PO CHEW: 80.0000 mg | CHEWABLE_TABLET | ORAL | Status: DC | PRN

## 2018-02-24 MED ORDER — ONDANSETRON HCL 4 MG/2ML IJ SOLN: 4.0000 mg | INTRAMUSCULAR | Status: DC | PRN

## 2018-02-24 MED ORDER — SENNOSIDES-DOCUSATE SODIUM 8.6-50 MG PO TABS
2.0000 | ORAL_TABLET | ORAL | Status: DC
  Administered 2018-02-25: 2 via ORAL
  Filled 2018-02-24: qty 2

## 2018-02-24 MED ORDER — DIBUCAINE 1 % RE OINT: 1.0000 "application " | TOPICAL_OINTMENT | RECTAL | Status: DC | PRN

## 2018-02-24 MED ORDER — DIPHENHYDRAMINE HCL 25 MG PO CAPS: 25.0000 mg | ORAL_CAPSULE | Freq: Four times a day (QID) | ORAL | Status: DC | PRN

## 2018-02-24 MED ORDER — IBUPROFEN 600 MG PO TABS
600.0000 mg | ORAL_TABLET | Freq: Four times a day (QID) | ORAL | Status: DC
  Administered 2018-02-24 – 2018-02-25 (×6): 600 mg via ORAL
  Filled 2018-02-24 (×5): qty 1

## 2018-02-24 MED ORDER — BENZOCAINE-MENTHOL 20-0.5 % EX AERO
1.0000 "application " | INHALATION_SPRAY | CUTANEOUS | Status: DC | PRN
  Administered 2018-02-24: 1 via TOPICAL
  Filled 2018-02-24: qty 56

## 2018-02-24 NOTE — Lactation Note (Signed)
This note was copied from a baby's chart. Lactation Consultation Note  Patient Name: Yesenia Haney BJYNW'GToday's Date: 02/24/2018 Reason for consult: Initial assessment;Early term 5637-38.6wks  15 hours old early term female who is being exclusively BF by her mother, she's a P2. Per mom baby has not fed since 9:30 am because she's been "sleepy" only had a couple of attempts till 1 pm, then baby just slept the entire afternoon.  Explained to parents the importance of feeding baby at least 8-12 times in 24 hours, and waking baby up for feedings if early term or late preterm. Baby had already a large stool in diaper by the time she was put on mother's breast. After baby was dry and clean she was put to mother's breast, LC assisted with latch but mom would not remove her top and her sweater off, unable to do STS at this point.  Baby was able to latch in cross cradle position after a few attempts, explained to mom that they need full head and neck support in order to sustain a deep latch, LC also did some breast compressions and a few swallows were heard.   Mom will wake baby up for feedings every 3 hours and feed her STS. Taught mom how to hand express, she'll be doing that after feedings or if baby is too sleepy to arouse to protect her milk supply and will spoon feed baby her EBM on the next feeding. BF brochure, BF resources and feeding diary were reviewed. Parents are aware of LC services and will call PRN.  Maternal Data Formula Feeding for Exclusion: No Has patient been taught Hand Expression?: Yes Does the patient have breastfeeding experience prior to this delivery?: Yes  Feeding Feeding Type: Breast Fed Length of feed: 15 min(baby still nursing when exiting the room)  LATCH Score Latch: Repeated attempts needed to sustain latch, nipple held in mouth throughout feeding, stimulation needed to elicit sucking reflex.  Audible Swallowing: A few with stimulation  Type of Nipple: Everted at  rest and after stimulation  Comfort (Breast/Nipple): Soft / non-tender  Hold (Positioning): Assistance needed to correctly position infant at breast and maintain latch.  LATCH Score: 7  Interventions Interventions: Breast feeding basics reviewed;Assisted with latch;Breast massage;Hand express;Breast compression;Adjust position;Support pillows;Position options  Lactation Tools Discussed/Used WIC Program: No   Consult Status Consult Status: Follow-up Date: 02/25/18 Follow-up type: In-patient    Raheel Kunkle Yesenia Haney Yesenia Haney 02/24/2018, 6:30 PM

## 2018-02-24 NOTE — Plan of Care (Signed)
Pt condition will continue to improve 

## 2018-02-24 NOTE — Progress Notes (Signed)
Subjective: Breathing with contractions. Objective: BP 109/65   Pulse 74   Temp 98 F (36.7 C) (Oral)   Resp 20   Ht 5\' 9"  (1.753 m)   Wt 77.6 kg (171 lb)   SpO2 100%   BMI 25.25 kg/m  No intake/output data recorded. No intake/output data recorded.  FHT: Category 1 UC:   regular, every 3 minutes SVE:   8/100/0  Assessment:  37 yo G2P1001 tolac in active labor Cat 1 strip  Plan: Anticipate SVD  Kenney HousemanNancy Jean Mitali Shenefield CNM, MSN 02/24/2018, 2:21 AM

## 2018-02-24 NOTE — Progress Notes (Signed)
Subjective: Feeling pressure with contractions.  Objective: BP 109/65   Pulse 74   Temp 98 F (36.7 C) (Oral)   Resp 20   Ht 5\' 9"  (1.753 m)   Wt 77.6 kg (171 lb)   SpO2 100%   BMI 25.25 kg/m  No intake/output data recorded. No intake/output data recorded.  FHT: Category 1 UC:   regular, every 2-3 minutes SVE:   Dilation: Lip/rim Effacement (%): 80 Station: 0, Plus 1 Exam by:: Harriett SineNancy, CNM SROM clear fluid Assessment:  37 yo G2P1001 tolac in active labor Cat 1 strip  Plan: Anticipate SVD  Kenney HousemanNancy Jean Darrin Apodaca CNM, MSN 02/24/2018, 2:23 AM

## 2018-02-25 LAB — CBC
HEMATOCRIT: 31.4 % — AB (ref 36.0–46.0)
Hemoglobin: 10.5 g/dL — ABNORMAL LOW (ref 12.0–15.0)
MCH: 30.8 pg (ref 26.0–34.0)
MCHC: 33.4 g/dL (ref 30.0–36.0)
MCV: 92.1 fL (ref 78.0–100.0)
PLATELETS: 193 10*3/uL (ref 150–400)
RBC: 3.41 MIL/uL — ABNORMAL LOW (ref 3.87–5.11)
RDW: 13.1 % (ref 11.5–15.5)
WBC: 11.8 10*3/uL — ABNORMAL HIGH (ref 4.0–10.5)

## 2018-02-25 LAB — BIRTH TISSUE RECOVERY COLLECTION (PLACENTA DONATION)

## 2018-02-25 MED ORDER — IBUPROFEN 600 MG PO TABS: 600.0000 mg | ORAL_TABLET | Freq: Four times a day (QID) | ORAL | 1 refills | Status: DC | PRN

## 2018-02-25 NOTE — Discharge Summary (Signed)
OB Discharge Summary     Patient Name: Yesenia Haney DOB: Apr 11, 1981 MRN: [id number]  Date of admission: 02/23/2018 Delivering MD: Kenney Houseman   Date of discharge: 02/25/2018  Admitting diagnosis: PREG Intrauterine pregnancy: [redacted]w[redacted]d     Secondary diagnosis:  Active Problems:   Previous cesarean section complicating pregnancy   Normal labor   Vaginal birth after cesarean delivery  Additional problems: None     Discharge diagnosis: Term Pregnancy Delivered and VBAC                                                                                                Post partum procedures:None  Augmentation: None  Complications: None  Hospital course:  Onset of Labor With Vaginal Delivery     37 y.o. yo [id number] at 108w4d was admitted in Active Labor on 02/23/2018. Patient had an uncomplicated labor course as follows:  Membrane Rupture Time/Date: 2:15 AM ,02/24/2018   Intrapartum Procedures: Episiotomy: None [1]                                         Lacerations:  1st degree [2];Periurethral [8];Vaginal [6]  Patient had a delivery of a Viable infant. 02/24/2018  Information for the patient's newborn:  Perpetua, Elling [[id number]]  Delivery Method: Vag-Spont    Pateint had an uncomplicated postpartum course.  She is ambulating, tolerating a regular diet, passing flatus, and urinating well. Patient is discharged home in stable condition on 02/25/18.   Physical exam  Vitals:   02/24/18 0923 02/24/18 1300 02/24/18 1500 02/25/18 0430  BP: 93/67 97/78 96/67  104/65  Pulse: 73 78 70 68  Resp: 18 17 17 18   Temp: 98.4 F (36.9 C)  98.3 F (36.8 C) 98.3 F (36.8 C)  TempSrc: Axillary  Axillary Oral  SpO2: 100% 98% 99% 100%  Weight:      Height:       General: alert, cooperative and no distress Lochia: appropriate Uterine Fundus: firm Incision: N/A DVT Evaluation: No evidence of DVT seen on physical exam. Labs: Lab Results  Component Value Date   WBC 11.8 (H)  02/25/2018   HGB 10.5 (L) 02/25/2018   HCT 31.4 (L) 02/25/2018   MCV 92.1 02/25/2018   PLT 193 02/25/2018   No flowsheet data found.  Discharge instruction: per After Visit Summary and "Baby and Me Booklet".  After visit meds:  Allergies as of 02/25/2018      Reactions   Bee Venom Itching, Nausea And Vomiting, Swelling   Flu-like symptoms      Medication List    STOP taking these medications   oxyCODONE-acetaminophen 5-325 MG tablet Commonly known as:  PERCOCET/ROXICET     TAKE these medications   calcium carbonate 500 MG chewable tablet Commonly known as:  TUMS - dosed in mg elemental calcium Chew 1 tablet by mouth 2 (two) times daily as needed for indigestion or heartburn.   ibuprofen 600 MG tablet Commonly known as:  ADVIL,MOTRIN Take 1 tablet (600 mg  total) by mouth every 6 (six) hours as needed for mild pain or moderate pain. What changed:  reasons to take this   prenatal multivitamin Tabs tablet Take 1 tablet by mouth daily at 12 noon.   senna-docusate 8.6-50 MG tablet Commonly known as:  Senokot-S Take 2 tablets by mouth daily. As needed for constipation       Diet: routine diet  Activity: Advance as tolerated. Pelvic rest for 6 weeks.   Outpatient follow up:6 weeks Follow up Appt:No future appointments. Follow up Visit:No Follow-up on file.  Postpartum contraception: Progesterone only pills  Newborn Data: Live born female  Birth Weight: 6 lb 15.6 oz (3164 g) APGAR: 8, 9  Newborn Delivery   Birth date/time:  02/24/2018 02:58:00 Delivery type:  Vaginal, Spontaneous     Baby Feeding: Breast Disposition:home with mother   02/25/2018 Geryl RankinsEvelyn Kalliopi Coupland, MD

## 2018-02-25 NOTE — Discharge Instructions (Signed)
Postpartum Care After Vaginal Delivery °The period of time right after you deliver your newborn is called the postpartum period. °What kind of medical care will I receive? °· You may continue to receive fluids and medicines through an IV tube inserted into one of your veins. °· If an incision was made near your vagina (episiotomy) or if you had some vaginal tearing during delivery, cold compresses may be placed on your episiotomy or your tear. This helps to reduce pain and swelling. °· You may be given a squirt bottle to use when you go to the bathroom. You may use this until you are comfortable wiping as usual. To use the squirt bottle, follow these steps: °? Before you urinate, fill the squirt bottle with warm water. Do not use hot water. °? After you urinate, while you are sitting on the toilet, use the squirt bottle to rinse the area around your urethra and vaginal opening. This rinses away any urine and blood. °? You may do this instead of wiping. As you start healing, you may use the squirt bottle before wiping yourself. Make sure to wipe gently. °? Fill the squirt bottle with clean water every time you use the bathroom. °· You will be given sanitary pads to wear. °How can I expect to feel? °· You may not feel the need to urinate for several hours after delivery. °· You will have some soreness and pain in your abdomen and vagina. °· If you are breastfeeding, you may have uterine contractions every time you breastfeed for up to several weeks postpartum. Uterine contractions help your uterus return to its normal size. °· It is normal to have vaginal bleeding (lochia) after delivery. The amount and appearance of lochia is often similar to a menstrual period in the first week after delivery. It will gradually decrease over the next few weeks to a dry, yellow-brown discharge. For most women, lochia stops completely by 6-8 weeks after delivery. Vaginal bleeding can vary from woman to woman. °· Within the first few  days after delivery, you may have breast engorgement. This is when your breasts feel heavy, full, and uncomfortable. Your breasts may also throb and feel hard, tightly stretched, warm, and tender. After this occurs, you may have milk leaking from your breasts. Your health care provider can help you relieve discomfort due to breast engorgement. Breast engorgement should go away within a few days. °· You may feel more sad or worried than normal due to hormonal changes after delivery. These feelings should not last more than a few days. If these feelings do not go away after several days, speak with your health care provider. °How should I care for myself? °· Tell your health care provider if you have pain or discomfort. °· Drink enough water to keep your urine clear or pale yellow. °· Wash your hands thoroughly with soap and water for at least 20 seconds after changing your sanitary pads, after using the toilet, and before holding or feeding your baby. °· If you are not breastfeeding, avoid touching your breasts a lot. Doing this can make your breasts produce more milk. °· If you become weak or lightheaded, or you feel like you might faint, ask for help before: °? Getting out of bed. °? Showering. °· Change your sanitary pads frequently. Watch for any changes in your flow, such as a sudden increase in volume, a change in color, the passing of large blood clots. If you pass a blood clot from your vagina, save it   to show to your health care provider. Do not flush blood clots down the toilet without having your health care provider look at them. °· Make sure that all your vaccinations are up to date. This can help protect you and your baby from getting certain diseases. You may need to have immunizations done before you leave the hospital. °· If desired, talk with your health care provider about methods of family planning or birth control (contraception). °How can I start bonding with my baby? °Spending as much time as  possible with your baby is very important. During this time, you and your baby can get to know each other and develop a bond. Having your baby stay with you in your room (rooming in) can give you time to get to know your baby. Rooming in can also help you become comfortable caring for your baby. Breastfeeding can also help you bond with your baby. °How can I plan for returning home with my baby? °· Make sure that you have a car seat installed in your vehicle. °? Your car seat should be checked by a certified car seat installer to make sure that it is installed safely. °? Make sure that your baby fits into the car seat safely. °· Ask your health care provider any questions you have about caring for yourself or your baby. Make sure that you are able to contact your health care provider with any questions after leaving the hospital. °This information is not intended to replace advice given to you by your health care provider. Make sure you discuss any questions you have with your health care provider. °Document Released: 09/26/2007 Document Revised: 05/03/2016 Document Reviewed: 11/03/2015 °[employer] Interactive Patient Education © 2018 [employer] ° °

## 2018-03-28 ENCOUNTER — Ambulatory Visit: Payer: Self-pay

## 2018-03-28 NOTE — Lactation Note (Signed)
This note was copied from a baby's chart.  03/28/2018  Name: Yesenia Haney MRN: [id number] Date of Birth: 02/24/2019 Gestational Age: Gestational Age: [id number] Birth Weight: 111.6 oz Weight today:    8 pounds 10.5 ounces (3926 grams) with clean newborn diaper.   Yesenia Haney has gained 931 grams in the last 31 days with an average daily weight gain of 30 grams a day.   Yesenia Haney presents today with mom and little brother for feeding assessment. Mom reports feeding infant at the breast is painful and she notices pinching with feeding. Mom has some bleeding from the right nipple last week and some bleeding from left breast when pumping last week. Mom is using coconut oil to the nipples, they have healed some since last week.  Infant spit up a large amount of bright red blood last week and had some blood in her stool prior to that.   Mom has been pumping and bottle feeding since she noted the bleeding and blood in infant. She reports she last saw blood from the breasts on Friday (4 days ago). Mom is using an Ameda pump and keeps the suction to Medium. Enc mom to continue pumping about 4 x a day to protect milk supply due to lip restriction and NS use. Discussed pros and cons of NS use. Mom to pump if infant not feeding at the breast.   Yesenia Haney is noted to have a tight labial frenulum that wraps around the bottom of the gum ridge. Her upper lip is tight with flanging and at the breast, mom has to flange upper lip at the breast and on the bottle. Infant with strong seal on gloved finger and cups tongue well. She is noted to have good tongue elevation, lateralization and extension. Infant is noted to be tongue thrusting at the breast. Nipple was compressed post feeding on both breasts. Mom reports infant does chole on the bottle some and drools with the bottle. Mom given information on tongue/lip restrictions and local providers to pursue if she and dad wishes. Discussed that if nipple pain not improving it  may be helpful to have infant evaluated by Oral Specialist.  Mom latched infant to the left breast in the cradle hold. She allowed infant to latch with a narrow mouth and sucking nipple into mouth. Infant latches shallowly and need upper lip flanging. Infant tongue thrusting and needing to relatch several times. Infant fed for < 10 minutes and transferred 34 Haney.   Reviewed suck training prior to latch and showed mom how to perform.   Worked with mom to help infant get wide open mouth and direct infant to the breast deeper. Infant fed for, 5 minutes and came off and did not want to relatch.   Showed mom how to apply # 24 NS to the right breast and relatch infant, mom reports no pinching. She used a NS with her son for a while. Infant was on and off the NS but would settle in to feed. Infant transferred 30 Haney. Mom more comfortable with NS in use.   Infant to follow up with Ped at 2 weeks. Mom to follow up with OB on 4/26, she is to call OB prior to that time if bleeding to nipples or breast return. Mom to follow up with Lactation as needed-mom's preference.      General Information: Mother's reason for visit: Bleeding from breasts, pinched nipples with feeding Consult: Initial Lactation consultant: Noralee StainSharon Miyako Oelke RN,IBCLC Breastfeeding experience: painful pinching to nipple with  feeding   Maternal medications: Pre-natal vitamin  Breastfeeding History: Frequency of breast feeding: every 2 hours during the day, less often at night Duration of feeding: 10 minutes each side  Supplementation: Supplement method: bottle(Dr. Brown's Bottle)         Breast milk volume: 3-4 ounces Breast milk frequency: every 5-6 hours   Pump type: Ameda Pump frequency: 8 x a day Pump volume: 2 ounces  Infant Output Assessment: Voids per 24 hours: 8+ Urine color: Clear yellow Stools per 24 hours: 6+ Stool color: Yellow  Breast Assessment: Breast: Filling Nipple: Erect, Scabs(bleeding from right  nipple last week) Pain level: 7 Pain interventions: Bra, Coconut oil  Feeding Assessment: Infant oral assessment: Variance Infant oral assessment comment: Yesenia Haney is noted to have a tight labial frenulum that wraps around the bottom of the gum ridge. Her upper lip is tight with flanging and at the breast, mom has to flange upper lip at the breast and on the bottle. Infant with strong seal on gloved finger and cups tongue well. She is noted to have good tongue elevation, lateralization and extension. Infant is noted to be tongue thrusting at the breast. Nipple was compressed post feeding on both breasts. Mom reports infant does chole on the bottle some and drools with the bottle.  Positioning: Cradle(left breast) Latch: 1 - Repeated attempts needed to sustain latch, nipple held in mouth throughout feeding, stimulation needed to elicit sucking reflex. Audible swallowing: 2 - Spontaneous and intermittent Type of nipple: 2 - Everted at rest and after stimulation Comfort: 1 - Filling, red/small blisters or bruises, mild/mod discomfort Hold: 1 - Assistance needed to correctly position infant at breast and maintain latch LATCH score: 7 Latch assessment: Shallow Lips flanged: No Suck assessment: Displays both   Pre-feed weight: 3926 grams Post feed weight: 3960 grams Amount transferred: 34 Haney Amount supplemented: 0  Additional Feeding Assessment: Infant oral assessment: Variance Infant oral assessment comment: see above Positioning: Cross cradle Latch: 1 - Repeated attempts neede to sustain latch, nipple held in mouth throughout feeding, stimulation needed to elicit sucking reflex. Audible swallowing: 2 - Spontaneous and intermittent Type of nipple: 2 - Everted at rest and after stimulation Comfort: 1 - Filling, red/small blisters or bruises, mild/mod discomfort Hold: 1 - Assistance needed to correctly position infant at breast and maintain latch LATCH score: 7 Latch assessment: Shallow Lips  flanged: No Suck assessment: Displays both Tools: Nipple shield 24 mm Pre-feed weight: 3960 grams Post feed weight: 3990 grams Amount transferred: 30 Haney Amount supplemented: 0  Totals: Total amount transferred: 64 Haney Total supplement given: 0 Total amount pumped post feed: 0   Plan: 1. Continue to offer the breast with each feeding 2. Empty first breast before offering the second breast 3. Wait for wide open mouth before latching infant to the breast, pull infant deep onto the breast 4. Suck training prior to each feeding per handout  5. Use the # 24 nipple shield with feeding as needed for pain/pinching 6. Yesenia Haney (2.5-3.5 ounces) with 8 feedings a day or 585 Haney-780 Haney (19.5-26 ounces) a day 7. Offer bottle post BF if infant still cueing to feed 8. Continue pumping 4 x a day times a day post breastfeeding to protect milk supply or is not putting to breast for a feeding 9. Keep up the good work 10. Thank you for allowing me to assist you today 11. Call with any questions/concerns [phone] 12. Follow up with Lactation as  needed    Ed Blalock RN, IBCLC                                                      Silas Flood Kriss Ishler 03/28/2018, 9:10 AM

## 2018-04-07 DIAGNOSIS — Z124 Encounter for screening for malignant neoplasm of cervix: Secondary | ICD-10-CM | POA: Diagnosis not present

## 2018-05-04 DIAGNOSIS — K080 Exfoliation of teeth due to systemic causes: Secondary | ICD-10-CM | POA: Diagnosis not present

## 2018-06-14 DIAGNOSIS — M531 Cervicobrachial syndrome: Secondary | ICD-10-CM | POA: Diagnosis not present

## 2018-06-14 DIAGNOSIS — M9902 Segmental and somatic dysfunction of thoracic region: Secondary | ICD-10-CM | POA: Diagnosis not present

## 2018-06-14 DIAGNOSIS — M546 Pain in thoracic spine: Secondary | ICD-10-CM | POA: Diagnosis not present

## 2018-06-14 DIAGNOSIS — M9901 Segmental and somatic dysfunction of cervical region: Secondary | ICD-10-CM | POA: Diagnosis not present

## 2018-07-14 DIAGNOSIS — M9902 Segmental and somatic dysfunction of thoracic region: Secondary | ICD-10-CM | POA: Diagnosis not present

## 2018-07-14 DIAGNOSIS — M546 Pain in thoracic spine: Secondary | ICD-10-CM | POA: Diagnosis not present

## 2018-07-14 DIAGNOSIS — M531 Cervicobrachial syndrome: Secondary | ICD-10-CM | POA: Diagnosis not present

## 2018-07-14 DIAGNOSIS — M9901 Segmental and somatic dysfunction of cervical region: Secondary | ICD-10-CM | POA: Diagnosis not present

## 2018-07-27 DIAGNOSIS — N649 Disorder of breast, unspecified: Secondary | ICD-10-CM | POA: Diagnosis not present

## 2018-07-31 ENCOUNTER — Other Ambulatory Visit: Payer: Self-pay | Admitting: Family Medicine

## 2018-07-31 DIAGNOSIS — N6459 Other signs and symptoms in breast: Secondary | ICD-10-CM

## 2018-08-08 ENCOUNTER — Ambulatory Visit
Admission: RE | Admit: 2018-08-08 | Discharge: 2018-08-08 | Disposition: A | Discharge status: Home / Self Care | Payer: Federal, State, Local not specified - PPO | Source: Ambulatory Visit | Attending: Family Medicine | Admitting: Family Medicine

## 2018-08-08 ENCOUNTER — Ambulatory Visit: Payer: Federal, State, Local not specified - PPO

## 2018-08-08 DIAGNOSIS — R922 Inconclusive mammogram: Secondary | ICD-10-CM | POA: Diagnosis not present

## 2018-08-08 DIAGNOSIS — N6489 Other specified disorders of breast: Secondary | ICD-10-CM | POA: Diagnosis not present

## 2018-08-08 DIAGNOSIS — N6459 Other signs and symptoms in breast: Secondary | ICD-10-CM

## 2018-08-22 DIAGNOSIS — M9901 Segmental and somatic dysfunction of cervical region: Secondary | ICD-10-CM | POA: Diagnosis not present

## 2018-08-22 DIAGNOSIS — M9902 Segmental and somatic dysfunction of thoracic region: Secondary | ICD-10-CM | POA: Diagnosis not present

## 2018-08-22 DIAGNOSIS — M531 Cervicobrachial syndrome: Secondary | ICD-10-CM | POA: Diagnosis not present

## 2018-08-22 DIAGNOSIS — M546 Pain in thoracic spine: Secondary | ICD-10-CM | POA: Diagnosis not present

## 2018-09-05 DIAGNOSIS — M9902 Segmental and somatic dysfunction of thoracic region: Secondary | ICD-10-CM | POA: Diagnosis not present

## 2018-09-05 DIAGNOSIS — M546 Pain in thoracic spine: Secondary | ICD-10-CM | POA: Diagnosis not present

## 2018-09-05 DIAGNOSIS — M9901 Segmental and somatic dysfunction of cervical region: Secondary | ICD-10-CM | POA: Diagnosis not present

## 2018-09-05 DIAGNOSIS — M531 Cervicobrachial syndrome: Secondary | ICD-10-CM | POA: Diagnosis not present

## 2018-10-03 DIAGNOSIS — Z13220 Encounter for screening for lipoid disorders: Secondary | ICD-10-CM | POA: Diagnosis not present

## 2018-10-03 DIAGNOSIS — D508 Other iron deficiency anemias: Secondary | ICD-10-CM | POA: Diagnosis not present

## 2018-10-03 DIAGNOSIS — Z5181 Encounter for therapeutic drug level monitoring: Secondary | ICD-10-CM | POA: Diagnosis not present

## 2018-10-03 DIAGNOSIS — Z0000 Encounter for general adult medical examination without abnormal findings: Secondary | ICD-10-CM | POA: Diagnosis not present

## 2018-10-17 DIAGNOSIS — M25552 Pain in left hip: Secondary | ICD-10-CM | POA: Diagnosis not present

## 2018-10-17 DIAGNOSIS — M545 Low back pain: Secondary | ICD-10-CM | POA: Diagnosis not present

## 2018-10-17 DIAGNOSIS — M9901 Segmental and somatic dysfunction of cervical region: Secondary | ICD-10-CM | POA: Diagnosis not present

## 2018-10-17 DIAGNOSIS — M9905 Segmental and somatic dysfunction of pelvic region: Secondary | ICD-10-CM | POA: Diagnosis not present

## 2018-10-17 DIAGNOSIS — M546 Pain in thoracic spine: Secondary | ICD-10-CM | POA: Diagnosis not present

## 2018-10-17 DIAGNOSIS — M531 Cervicobrachial syndrome: Secondary | ICD-10-CM | POA: Diagnosis not present

## 2018-10-17 DIAGNOSIS — M9902 Segmental and somatic dysfunction of thoracic region: Secondary | ICD-10-CM | POA: Diagnosis not present

## 2018-10-17 DIAGNOSIS — M9903 Segmental and somatic dysfunction of lumbar region: Secondary | ICD-10-CM | POA: Diagnosis not present

## 2019-09-26 ENCOUNTER — Encounter (HOSPITAL_COMMUNITY): Payer: Self-pay

## 2019-11-22 ENCOUNTER — Other Ambulatory Visit: Payer: Self-pay

## 2019-11-26 ENCOUNTER — Other Ambulatory Visit (HOSPITAL_COMMUNITY): Payer: Self-pay | Admitting: Obstetrics & Gynecology

## 2019-11-26 DIAGNOSIS — O280 Abnormal hematological finding on antenatal screening of mother: Secondary | ICD-10-CM

## 2019-11-26 DIAGNOSIS — Z3689 Encounter for other specified antenatal screening: Secondary | ICD-10-CM

## 2019-11-26 DIAGNOSIS — Z3A20 20 weeks gestation of pregnancy: Secondary | ICD-10-CM

## 2019-12-14 NOTE — L&D Delivery Note (Signed)
Delivery Note At 11:14 AM a viable female was delivered via VBAC, Spontaneous (Presentation: Left Occiput Anterior).  APGAR: 9, 9; weight pending .   Placenta status: Spontaneous;Pathology, Intact.  Cord: 3 vessels with the following complications: None.  Cord pH: n/a  Anesthesia: Local Episiotomy: None Lacerations:  Right periurethral Suture Repair: 4-0 vicryl Est. Blood Loss (mL): 100  Mom to postpartum.  Baby to Couplet care / Skin to Skin.  Sharon Seller 04/02/2020, 12:14 PM

## 2019-12-19 ENCOUNTER — Ambulatory Visit (HOSPITAL_BASED_OUTPATIENT_CLINIC_OR_DEPARTMENT_OTHER): Payer: No Typology Code available for payment source | Admitting: Genetic Counselor

## 2019-12-19 ENCOUNTER — Ambulatory Visit (HOSPITAL_COMMUNITY): Payer: No Typology Code available for payment source | Admitting: *Deleted

## 2019-12-19 ENCOUNTER — Ambulatory Visit (HOSPITAL_COMMUNITY)
Admission: RE | Admit: 2019-12-19 | Discharge: 2019-12-19 | Disposition: A | Discharge status: Home / Self Care | Payer: No Typology Code available for payment source | Source: Ambulatory Visit | Attending: Obstetrics and Gynecology | Admitting: Obstetrics and Gynecology

## 2019-12-19 ENCOUNTER — Ambulatory Visit (HOSPITAL_COMMUNITY): Payer: Self-pay | Admitting: Genetic Counselor

## 2019-12-19 ENCOUNTER — Encounter (HOSPITAL_COMMUNITY): Payer: Self-pay

## 2019-12-19 ENCOUNTER — Other Ambulatory Visit: Payer: Self-pay

## 2019-12-19 VITALS — BP 118/67 | HR 77 | Temp 98.0°F | Wt 161.2 lb

## 2019-12-19 DIAGNOSIS — Z3689 Encounter for other specified antenatal screening: Secondary | ICD-10-CM | POA: Diagnosis present

## 2019-12-19 DIAGNOSIS — O280 Abnormal hematological finding on antenatal screening of mother: Secondary | ICD-10-CM

## 2019-12-19 DIAGNOSIS — R772 Abnormality of alphafetoprotein: Secondary | ICD-10-CM

## 2019-12-19 DIAGNOSIS — Z3A20 20 weeks gestation of pregnancy: Secondary | ICD-10-CM | POA: Insufficient documentation

## 2019-12-19 DIAGNOSIS — O09522 Supervision of elderly multigravida, second trimester: Secondary | ICD-10-CM | POA: Diagnosis not present

## 2019-12-19 DIAGNOSIS — O281 Abnormal biochemical finding on antenatal screening of mother: Secondary | ICD-10-CM | POA: Insufficient documentation

## 2019-12-19 DIAGNOSIS — O34219 Maternal care for unspecified type scar from previous cesarean delivery: Secondary | ICD-10-CM | POA: Diagnosis not present

## 2019-12-19 DIAGNOSIS — Z315 Encounter for genetic counseling: Secondary | ICD-10-CM

## 2019-12-19 NOTE — Consult Note (Signed)
MFM Note  This patient was seen in consultation due to an elevated MSAFP of 6.32 MoM and due to advanced maternal age.  She reports no complications in her current pregnancy.  She denies any significant past medical or family history.    Her quad screen indicated a low risk for trisomy 69 and 3.  The patient was advised of the ultrasound findings that failed to reveal an anatomical cause for the increased MSAFP.  There were no sonographic signs of spina bifida or an anterior abdominal wall defect noted today.  She was advised regarding the limitations of ultrasound in the detection of all anomalies and that it will diagnose approximately 90% of neural tube defects.  The association of an elevated MSAFP with placental dysfunction which may manifest later in her pregnancy as fetal growth restriction was also discussed.  Due to the elevated MSAFP and advanced maternal age, the patient was offered and declined an amniocentesis today for definitive diagnosis of fetal aneuploidy and spina bifida.  Due to the elevated MSAFP, she should continue to be followed with growth ultrasounds every 4 to 5 weeks.  These ultrasounds may be performed in your office or ours.  Weekly fetal testing should be started at around 32 weeks and continued until delivery.  Due to the severely elevated MSAFP level, she should probably be delivered at around 39 weeks.    No further exams were scheduled in our office.  We would be happy to see her in the future if necessary.  A total of 30 minutes was spent counseling and coordinating the care for this patient.  Greater than 50% of the time was spent in direct face-to-face contact.

## 2019-12-19 NOTE — Progress Notes (Signed)
12/19/2019  Yesenia Haney 01/14/1981 MRN: [id number] DOV: 12/19/2019  Yesenia Haney presented to the [facility] for a genetics consultation regarding abnormal quad screen results. Yesenia Haney came to her appointment alone due to COVID-19 visitor restrictions.   Indication for genetic counseling - Increased risk for open neural tube defect onquadscreening (6.32 MoM)  Prenatal history  Yesenia Haney is a [id number], 39 y.o. female. Her current pregnancy has completed [redacted]w[redacted]d (Estimated Date of Delivery: 05/06/20).  Yesenia Haney denied exposure to environmental toxins or chemical agents. She denied the use of alcohol, tobacco or street drugs. She reported taking prenatal vitamins. She denied significant viral illnesses, fevers, and bleeding during the course of her pregnancy. Her medical and surgical histories were noncontributory.  Family History  A three generation pedigree was drafted and reviewed. The family history is remarkable for the following:  - Yesenia Haney has a 47 year old son with global developmental delays. He received physical therapy and is still receiving occupational and speech therapy. His fine motor skills are still delayed, and while he has expressive speech, he has echolalia and speaks in mostly "canned phrases". He is going to be evaluated for autism soon. Per Yesenia Haney, her son has had a genetics workup that was negative. We discussed that while developmental delays can be a feature of many different genetic conditions, they also may be multifactorial in nature, occurring due to a combination of genetic and environmental factors that are difficult to identify. Developmental delays can appear to run in families; thus, there is a chance that the couple's other children could also experience developmental delays of some kind. Yesenia Haney understands that she should make the pediatrician aware of any concerns she has about her other children's  development.  - Yesenia Haney's mother had breast cancer that was diagnosed at the age of 17. Her maternal aunt died at the age of 12 from peritoneal cancer. Her maternal grandmother died of colon cancer in her 37s, and her maternal grandfather died of colon or stomach cancer in his 10s. Though most cancers are thought to be sporadic or due to environmental factors, some families appear to have a strong predisposition to cancers. When considering a family history of cancer, we look for common types of cancer in multiple family members occurring at younger than typical ages. Given that Yesenia Haney's family members all had cancer over the age of 50, it is unlikely that there is an inherited cancer syndrome in the family. However, if she is concerned about the family history of cancer and would like to learn more about the family's chance for an inherited cancer syndrome, her healthcare provider may refer her or her relatives to genetic counseling at the [facility] [phone]).   - Yesenia Haney's paternal grandfather was born with spina bifida occulta. He reportedly had a spinal defect with a tuft of hair on his lower back that did not interfere significantly with his life. The rest of his medical history was noncontributory. We discussed that many forms of spina bifida are also multifactorial in nature. Approximately  5-10% of individuals with open neural tube defects (ONTDs) such as spina bifida have an underlying chromosome condition; however, individuals with these conditions often have other features in addition to spina bifida. If Yesenia Haney's grandfather's spina bifida was isolated, there is likely a ~0.5-1% recurrence risk for Yesenia Haney's children.   The remaining family histories were reviewed and found to be noncontributory for birth defects, intellectual  disability, recurrent pregnancy loss, and known genetic conditions.    The patient's ethnicity is [place], [place], and  [place]. The father of the pregnancy's ethnicity is [place], [place], and [place]. Consanguinity was denied. Yesenia Haney was unsure of whether she or her husband have Ashkenazi Jewish ancestry. Pedigree will be scanned under Media.  Discussion  Yesenia Haney was referred to genetic counseling as quad screening identified an increased risk of an open neural tube defect (ONTD) for the current pregnancy due to elevated AFP (6.32 MoM). Results of the screen indicated that the current pregnancy has a 1 in 10 (10%) chance of being affected with an ONTD such as spina bifida.   We reviewed these results in detail. We discussed that there are many explanations for an elevated AFP result, including twin pregnancies, dating errors, ONTDs such as anencephaly or spina bifida, abdominal wall defects, placental abnormalities, fetal growth restriction, adverse obstetrical outcomes (oligohydramnios, placental abruption, intrauterine fetal demise/stillbirth, preterm delivery, premature rupture of membranes, or preeclampsia), or normal variation. Yesenia Haney had her anatomy ultrasound performed prior to her genetic counseling appointment. The ultrasound report will be sent under separate cover. There were no visualized fetal anomalies or markers suggestive of aneuploidy. Anatomy ultrasound also did not detect twins, ONTDs, abdominal wall defects, placental abnormalities, or oligohydramnios. Dating for the pregnancy is based on early ultrasound, so a dating error is not likely to be the cause for the elevated AFP result. In light of her normal anatomy ultrasound, it is possible that an explanation may not be found for [name]'s elevated AFP level. Given this unexplained result, it is recommended that the pregnancy be monitored more closely for fetal growth restriction and maternal hypertension moving forward.   We reviewed that in addition to the risk for ONTDs, quad screening assesses the risk for trisomy 25 (Down  syndrome) and trisomy 18 in the pregnancy. Based on the results of her screen, the risk for Yesenia Haney's fetus to be affected by Down syndrome decreased from her 1 in 69 age-related risk to 1 in 1523, and the risk for trisomy 18 was not increased over her 1 in 307 age-related risk. While this result significantly reduces the likelihood of the pregnancy being affected by trisomy 21 or trisomy 64, quad screening cannot be considered diagnostic.   Ms. Borel was counseled regarding diagnostic testing via amniocentesis. We discussed the technical aspects of the procedure and quoted up to a 1 in 500 (0.2%) risk for spontaneous pregnancy loss or other adverse pregnancy outcomes as a result of amniocentesis. Cultured cells from an amniocentesis sample allow for the visualization of a fetal karyotype, which can detect >99% of chromosomal aberrations. Chromosomal microarray can also be performed to identify smaller deletons or duplications of fetal chromosomal material. Amniocentesis could also be performed to assess whether the baby is affected by an ONTD via analysis of AF-AFP and acetylcholinesterase. After careful consideration, Ms. Romick declined amniocentesis at this time. She understands that amniocentesis is available at any point after 16 weeks of pregnancy and that she may opt to undergo the procedure at a later date should she change her mind.  Finally, per ACOG recommendation, carrier screening for hemoglobinopathies, cystic fibrosis (CF) and spinal muscular atrophy (SMA) was discussed including information about the conditions, rationale for testing, autosomal recessive inheritance, and the option of prenatal diagnosis. I offered carrier screening for each of these conditions, which Ms. Fitch declined at this time. Without carrier screening to refine risk and based on ethnicity alone,  Ms. Nickolas's risk to be a carrier of CF is 1 in 66. Her risk to be a carrier of SMA is 1 in 42. Her risk to be a  carrier of HBB-related hemoglobinopathies is 1 in 78. She was informed that select hemoglobinopathies and CF are included on [place]'s newborn screen, but that SMA currently is not included. We discussed the Early Check research study to add SMA to her baby's newborn screening panel. Ms. Nerio indicated that she was interested in pursuing this, so she was given written information on how to enroll in the Early Check study.   Additional screening and diagnostic testing were declined today. Ms. Folino was reassured by her normal anatomy ultrasound and plan for future monitoring. She understands that screening tests, including ultrasound, cannot rule out all birth defects or genetic syndromes. The patient was advised of this limitation and states she still does not want additional testing or screening at this time.   I counseled Ms. Steffensmeier regarding the above risks and available options. The approximate face-to-face time with the genetic counselor was 40 minutes.  In summary:  Reviewed quad screen results  Elevated MS-AFP (1 in 10 risk for open neural tube defects)  Reduction in risk for Down syndrome. Risk for trisomy 18 not elevated above age-related risk  Reviewed results of ultrasound  No fetal anomalies or markers seen  Reduction in risk for fetal aneuploidy & ONTDs  Discussed carrier screening for cystic fibrosis, spinal muscular atrophy, and hemoglobinopathies  Declined carrier screening  Given information on the Early Check study to add SMA to newborn screen  Offered additional testing and screening  Declined amniocentesis  Serial growth assessments recommended up until the time of delivery  Reviewed family history concerns   Gershon Crane, MS Genetic Counselor

## 2019-12-29 IMAGING — MG DIGITAL DIAGNOSTIC BILATERAL MAMMOGRAM WITH TOMO AND CAD
6 of 10 series · 6 of 30 positions shown · non-contrast
Comparison: None.

CLINICAL DATA: The patient has been breast feeding for 6 months.
Two instances of frank blood within her breast milk, coming out of
her right breast. No spontaneous nipple discharge outside of pumping
or breastfeeding.

EXAM:
DIGITAL DIAGNOSTIC BILATERAL MAMMOGRAM WITH CAD AND TOMO
ULTRASOUND RIGHT BREAST

[R MLO synth-2D (1 of 2)]
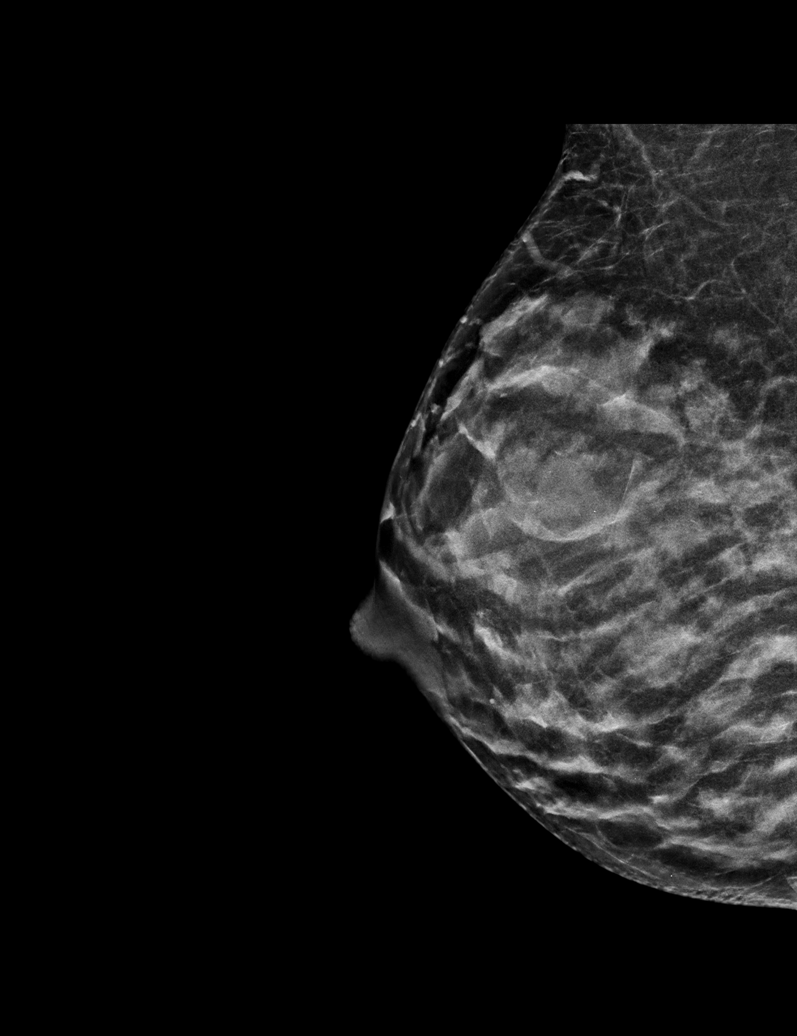

[L CC synth-2D]
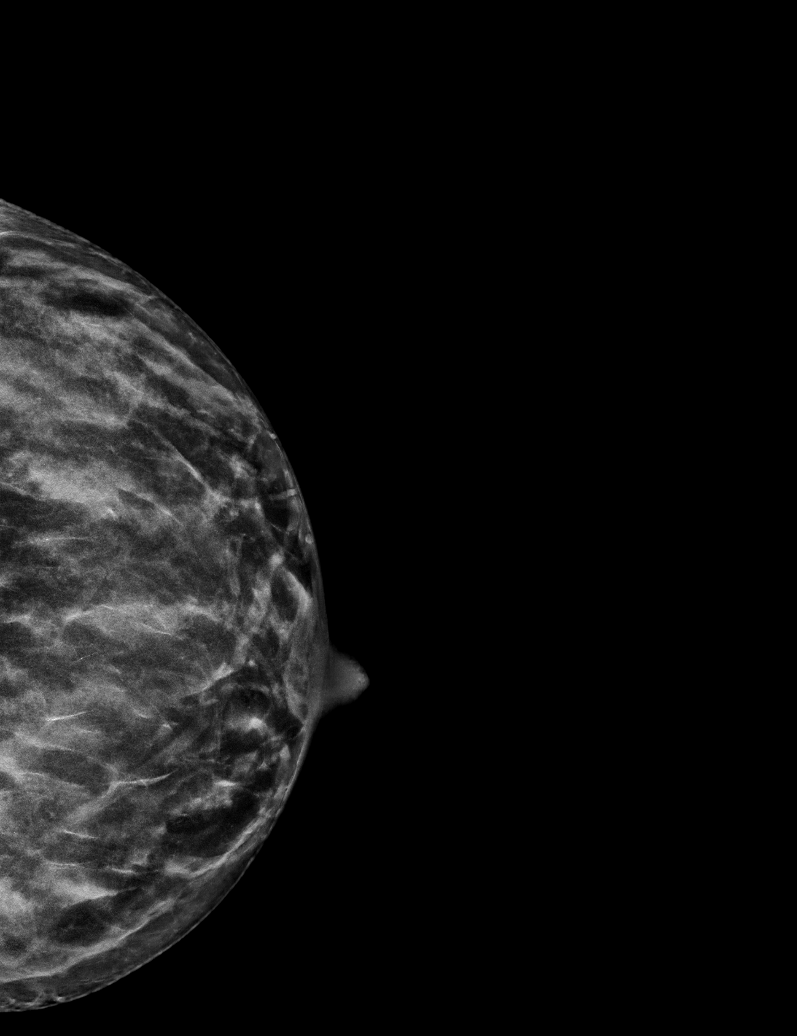

[L MLO synth-2D]
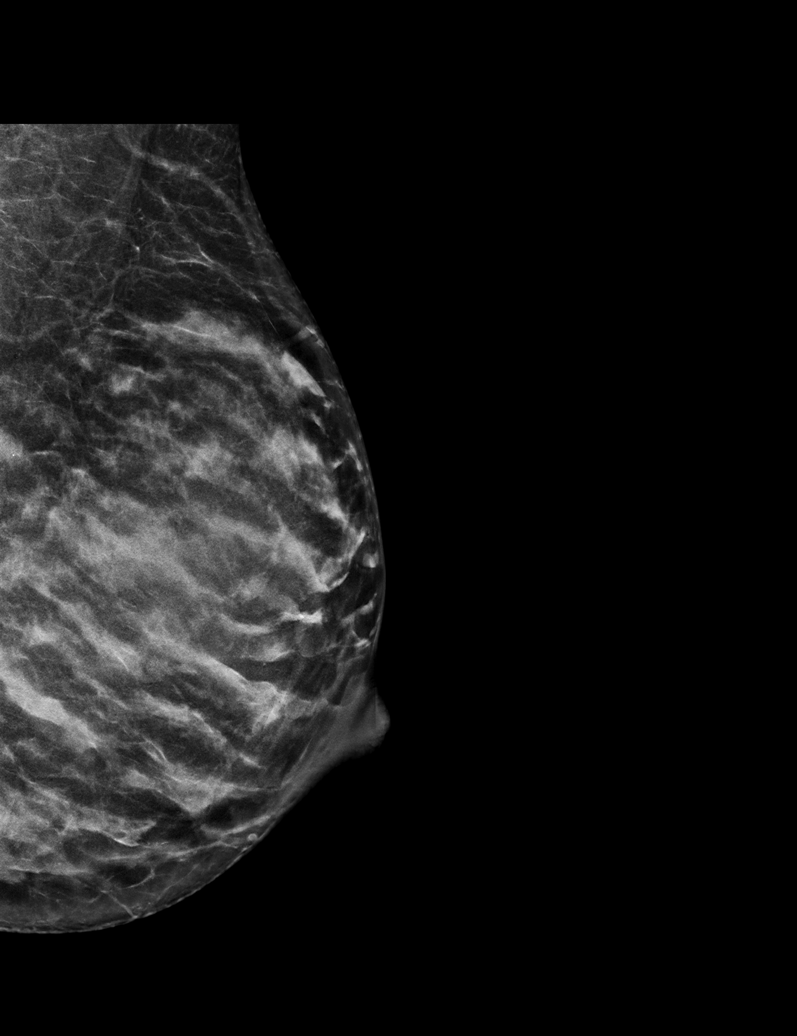

[R CC synth-2D]
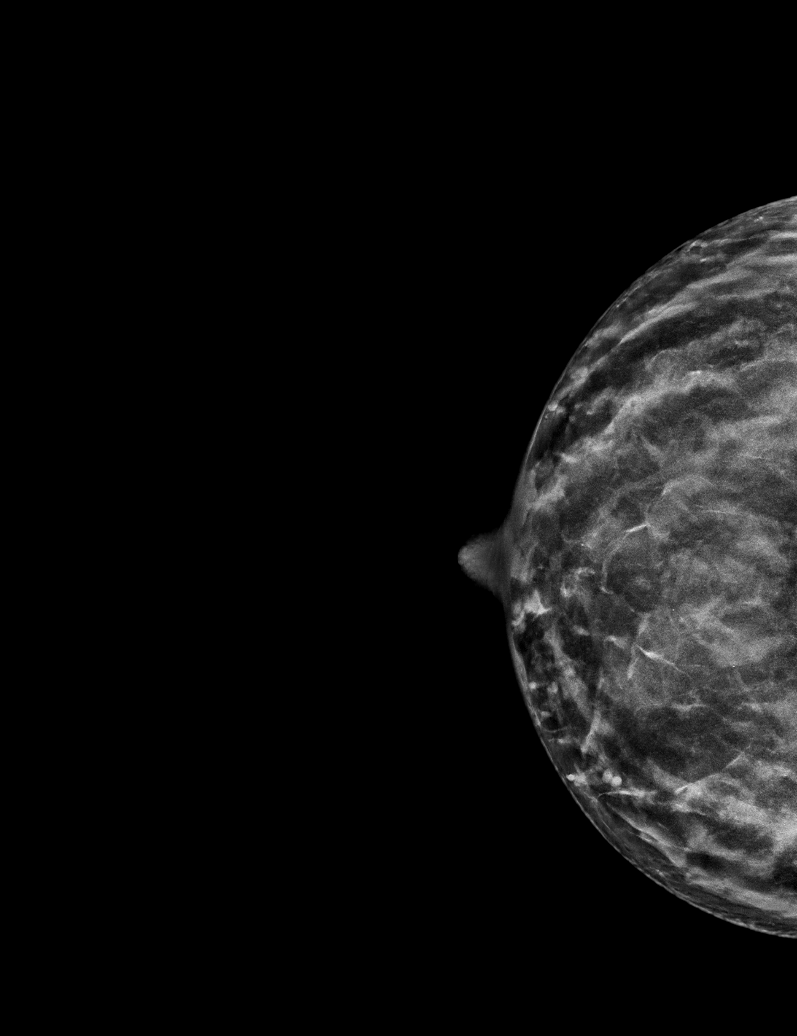

[R MLO synth-2D (2 of 2)]
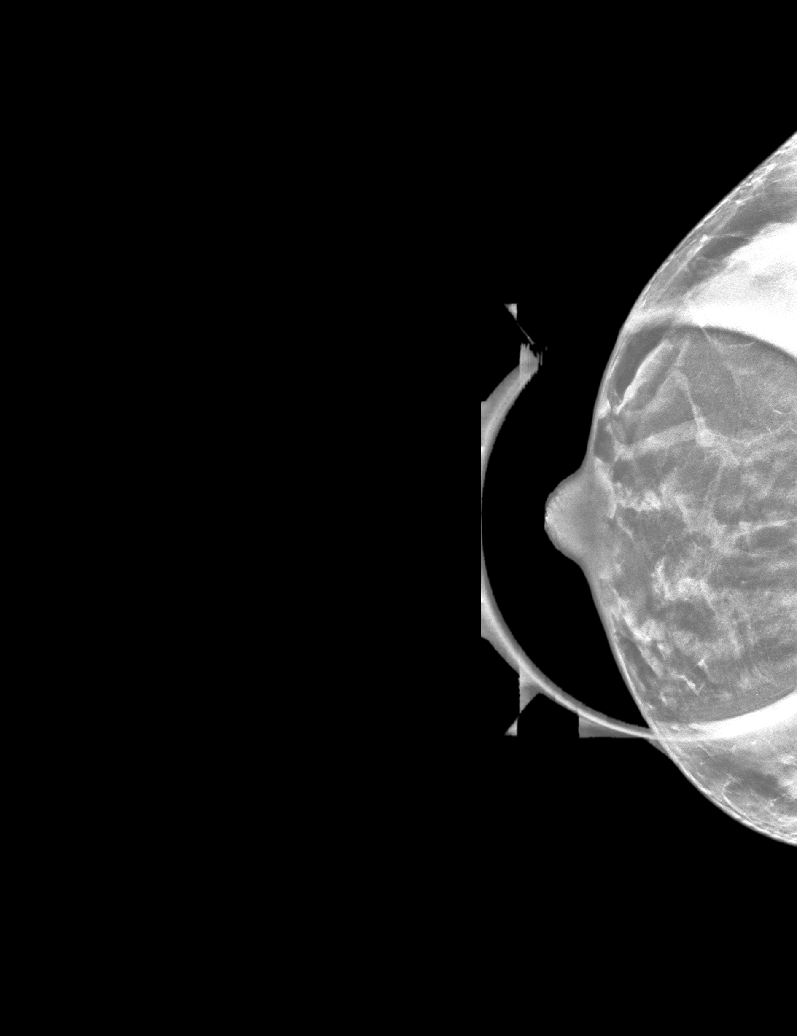

[L MLO tomo · tomo slice 29/57.0]
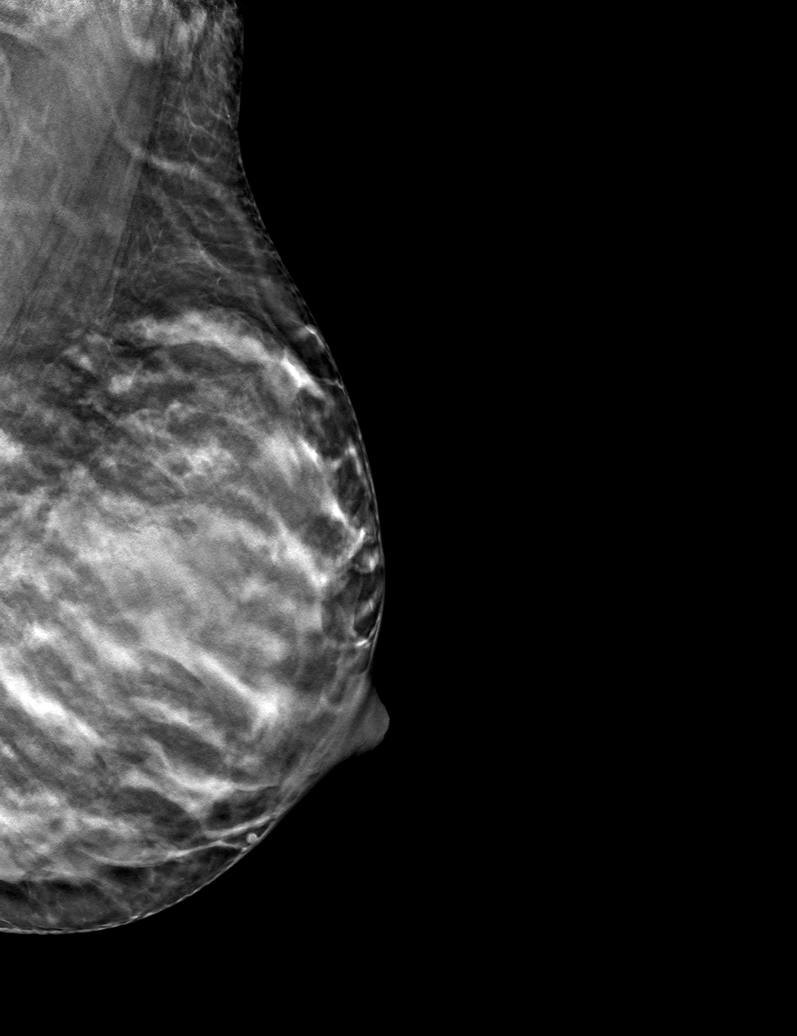

[6 of 30 positions shown; findings below may reference images not displayed]

ACR Breast Density Category d: The breast tissue is extremely dense,
which lowers the sensitivity of mammography.
FINDINGS: Mammographically, there are no suspicious masses, areas of
architectural distortion or microcalcifications in either breast.
Slightly prominent right axillary lymph nodes are noted
mammographically.

Mammographic images were processed with CAD.

On physical exam, no suspicious masses are palpated. No bloody
nipple discharge is elicited on exam.

Targeted ultrasound is performed, showing no suspicious masses or
shadowing lesions in the right breast. Prominent in size but benign
by morphology lymph nodes are seen in the right axilla.
IMPRESSION: No mammographic sonographic evidence of malignancy in either breast.

RECOMMENDATION:
Further management of patient's bloody right nipple discharge should
be based on clinical grounds.

MRI of the breast with contrast may be considered, after patient has
stopped breastfeeding, should her symptoms persist.

I have discussed the findings and recommendations with the patient.
Results were also provided in writing at the conclusion of the
visit. If applicable, a reminder letter will be sent to the patient
regarding the next appointment.

BI-RADS CATEGORY  1: Negative.

## 2019-12-29 IMAGING — US ULTRASOUND RIGHT BREAST LIMITED
1 series · 11 of 11 positions shown · non-contrast
Comparison: None.

CLINICAL DATA: The patient has been breast feeding for 6 months.
Two instances of frank blood within her breast milk, coming out of
her right breast. No spontaneous nipple discharge outside of pumping
or breastfeeding.

EXAM:
DIGITAL DIAGNOSTIC BILATERAL MAMMOGRAM WITH CAD AND TOMO
ULTRASOUND RIGHT BREAST

[Series 1: ultrasound right breast limited · 0.06mm/px · 11 of 11 slices shown]
[im 1/11]
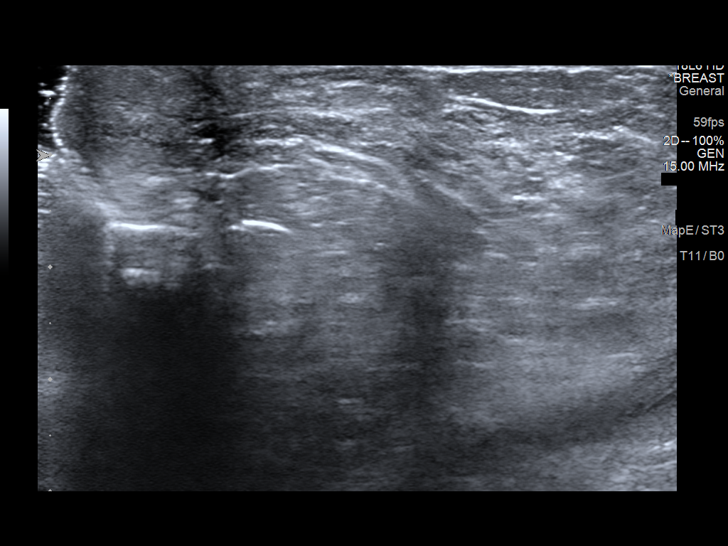
[im 2/11]
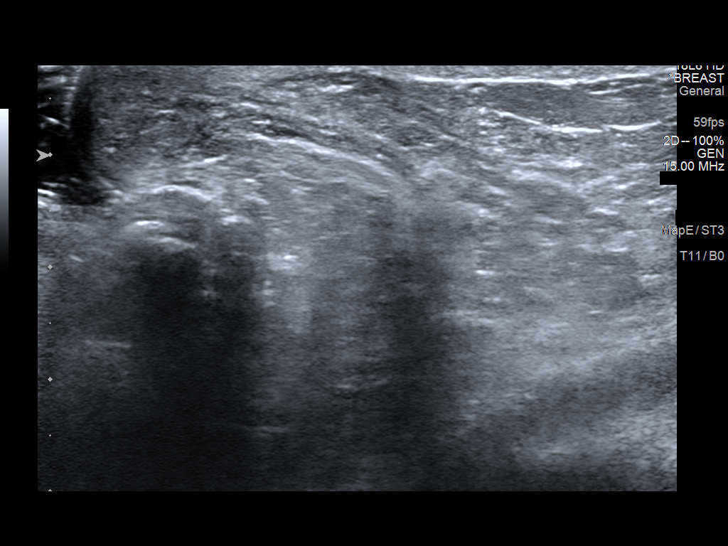
[im 3/11]
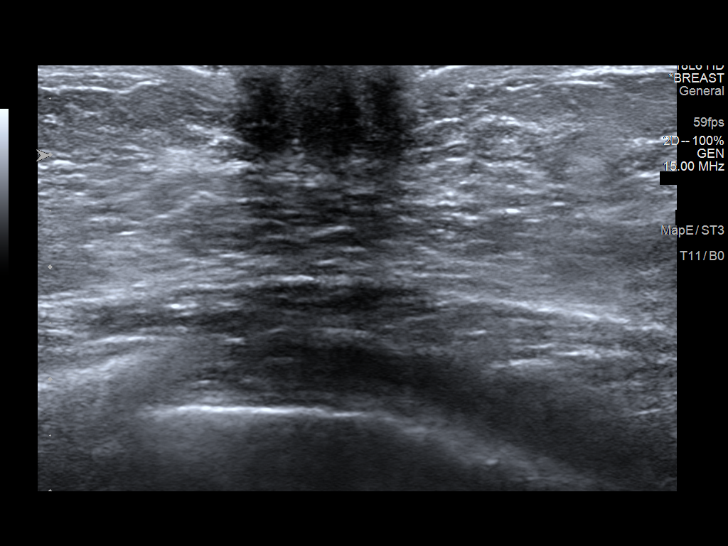
[im 4/11]
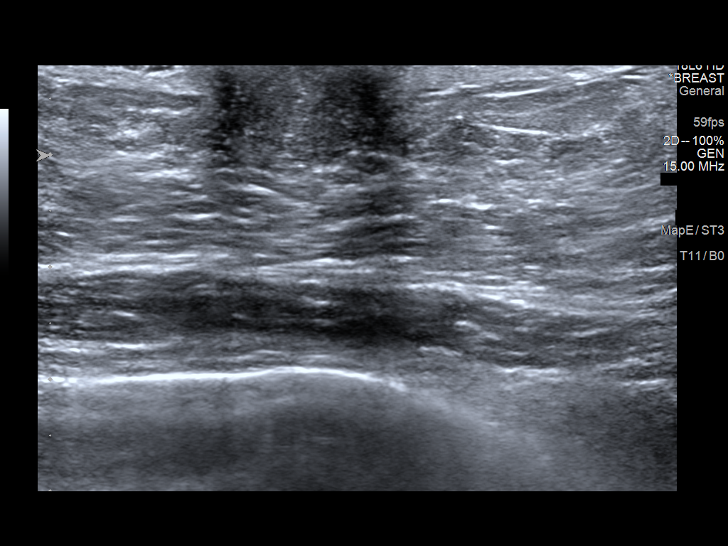
[im 5/11]
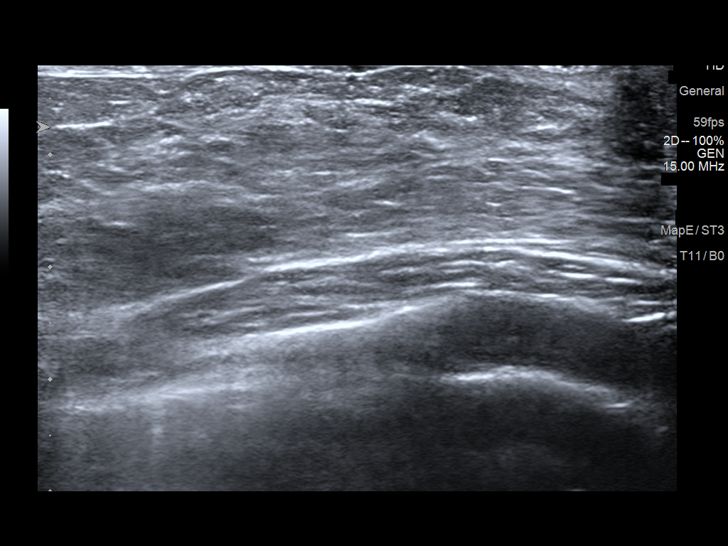
[im 6/11]
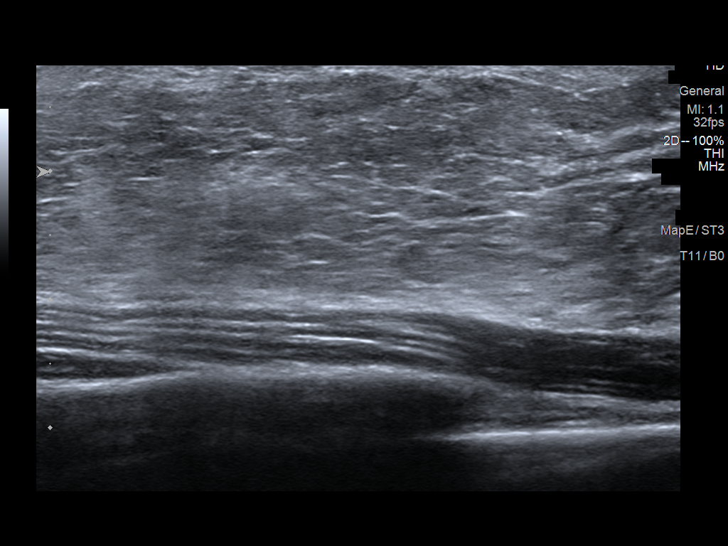
[im 7/11]
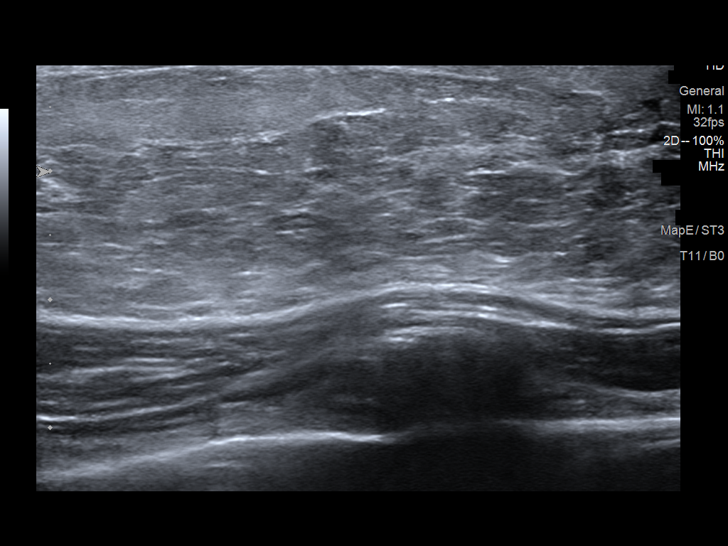
[im 8/11]
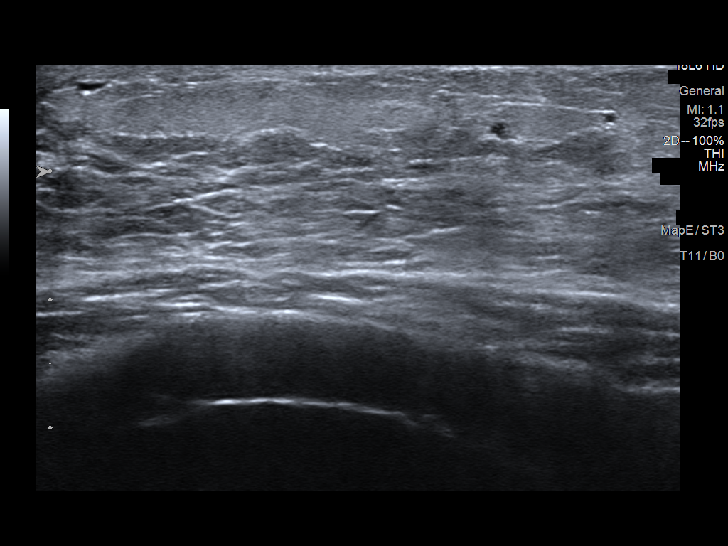
[im 9/11]
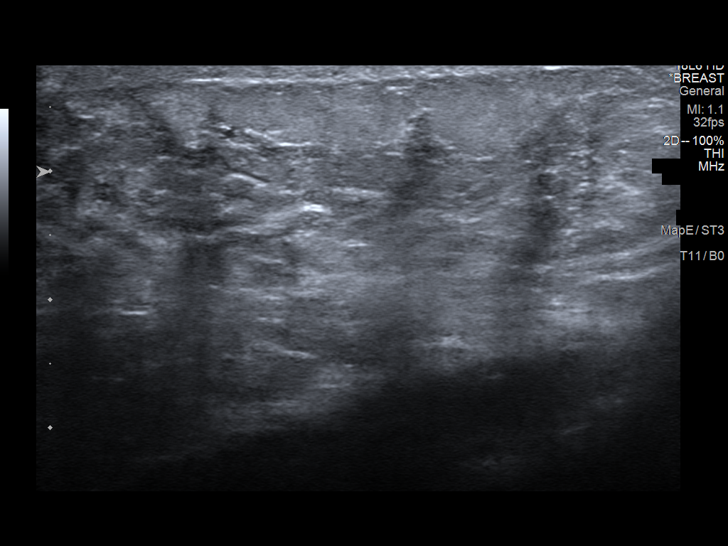
[im 10/11]
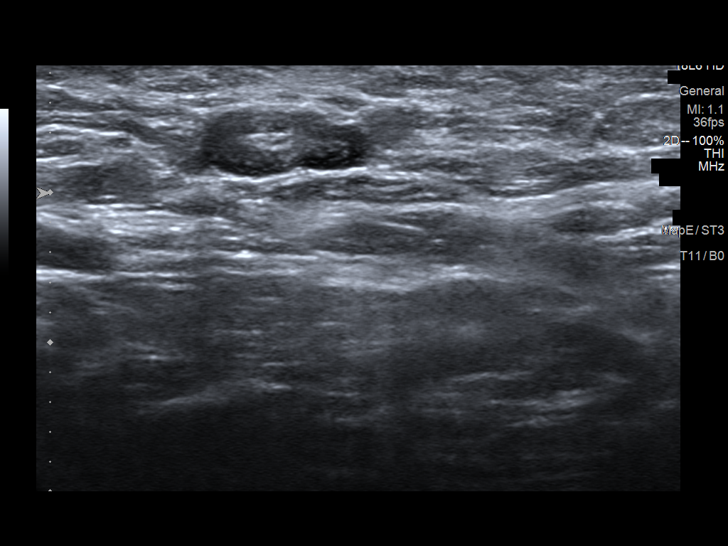
[im 11/11]
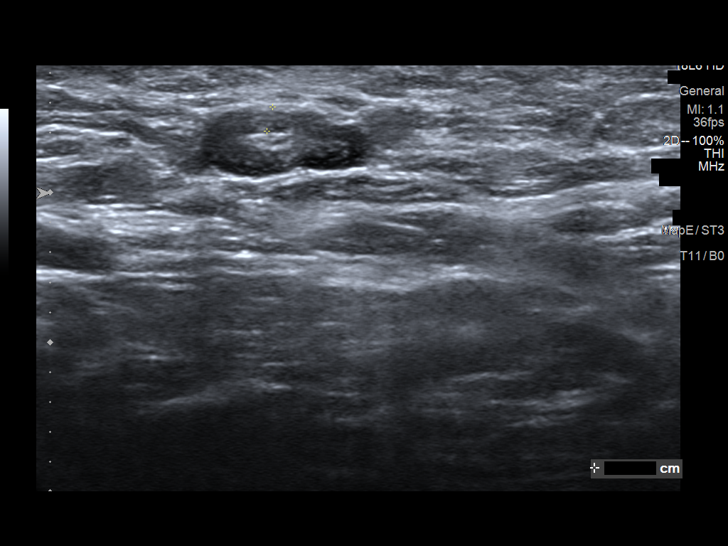

[11 of 11 positions shown; findings below may reference images not displayed]

ACR Breast Density Category d: The breast tissue is extremely dense,
which lowers the sensitivity of mammography.
FINDINGS: Mammographically, there are no suspicious masses, areas of
architectural distortion or microcalcifications in either breast.
Slightly prominent right axillary lymph nodes are noted
mammographically.

Mammographic images were processed with CAD.

On physical exam, no suspicious masses are palpated. No bloody
nipple discharge is elicited on exam.

Targeted ultrasound is performed, showing no suspicious masses or
shadowing lesions in the right breast. Prominent in size but benign
by morphology lymph nodes are seen in the right axilla.
IMPRESSION: No mammographic sonographic evidence of malignancy in either breast.

RECOMMENDATION:
Further management of patient's bloody right nipple discharge should
be based on clinical grounds.

MRI of the breast with contrast may be considered, after patient has
stopped breastfeeding, should her symptoms persist.

I have discussed the findings and recommendations with the patient.
Results were also provided in writing at the conclusion of the
visit. If applicable, a reminder letter will be sent to the patient
regarding the next appointment.

BI-RADS CATEGORY  1: Negative.

## 2020-04-02 ENCOUNTER — Encounter (HOSPITAL_COMMUNITY): Payer: Self-pay | Admitting: Obstetrics & Gynecology

## 2020-04-02 ENCOUNTER — Inpatient Hospital Stay (HOSPITAL_COMMUNITY)
Admission: AD | Admit: 2020-04-02 | Discharge: 2020-04-04 | DRG: 807 | Disposition: A | Discharge status: Home / Self Care | Payer: No Typology Code available for payment source | Attending: Obstetrics & Gynecology | Admitting: Obstetrics & Gynecology

## 2020-04-02 ENCOUNTER — Encounter (HOSPITAL_COMMUNITY): Payer: Self-pay | Admitting: Obstetrics and Gynecology

## 2020-04-02 ENCOUNTER — Other Ambulatory Visit: Payer: Self-pay

## 2020-04-02 DIAGNOSIS — Z20822 Contact with and (suspected) exposure to covid-19: Secondary | ICD-10-CM | POA: Diagnosis present

## 2020-04-02 DIAGNOSIS — Z3A35 35 weeks gestation of pregnancy: Secondary | ICD-10-CM

## 2020-04-02 DIAGNOSIS — O99824 Streptococcus B carrier state complicating childbirth: Secondary | ICD-10-CM | POA: Diagnosis present

## 2020-04-02 DIAGNOSIS — O34219 Maternal care for unspecified type scar from previous cesarean delivery: Principal | ICD-10-CM | POA: Diagnosis present

## 2020-04-02 DIAGNOSIS — O3433 Maternal care for cervical incompetence, third trimester: Secondary | ICD-10-CM

## 2020-04-02 DIAGNOSIS — O4703 False labor before 37 completed weeks of gestation, third trimester: Secondary | ICD-10-CM | POA: Diagnosis not present

## 2020-04-02 LAB — URINALYSIS, ROUTINE W REFLEX MICROSCOPIC
Bilirubin Urine: NEGATIVE
Glucose, UA: NEGATIVE mg/dL
Hgb urine dipstick: NEGATIVE
Ketones, ur: 5 mg/dL — AB
Nitrite: NEGATIVE
Protein, ur: 30 mg/dL — AB
Specific Gravity, Urine: 1.021 (ref 1.005–1.030)
Squamous Epithelial / LPF: >50 — ABNORMAL HIGH (ref 0–5)
WBC, UA: >50 WBC/hpf — ABNORMAL HIGH (ref 0–5)
pH: 5 (ref 5.0–8.0)

## 2020-04-02 LAB — CBC
HCT: 38 % (ref 36.0–46.0)
Hemoglobin: 12.5 g/dL (ref 12.0–15.0)
MCH: 30.9 pg (ref 26.0–34.0)
MCHC: 32.9 g/dL (ref 30.0–36.0)
MCV: 93.8 fL (ref 80.0–100.0)
Platelets: 211 10*3/uL (ref 150–400)
RBC: 4.05 MIL/uL (ref 3.87–5.11)
RDW: 12.4 % (ref 11.5–15.5)
WBC: 13.4 10*3/uL — ABNORMAL HIGH (ref 4.0–10.5)
nRBC: 0 % (ref 0.0–0.2)

## 2020-04-02 LAB — RESPIRATORY PANEL BY RT PCR (FLU A&B, COVID)
Influenza A by PCR: NEGATIVE
Influenza B by PCR: NEGATIVE
SARS Coronavirus 2 by RT PCR: NEGATIVE

## 2020-04-02 LAB — TYPE AND SCREEN
ABO/RH(D): O POS
Antibody Screen: NEGATIVE

## 2020-04-02 LAB — ABO/RH: ABO/RH(D): O POS

## 2020-04-02 LAB — RPR: RPR Ser Ql: NONREACTIVE

## 2020-04-02 MED ORDER — ONDANSETRON HCL 4 MG/2ML IJ SOLN: 4.0000 mg | Freq: Four times a day (QID) | INTRAMUSCULAR | Status: DC | PRN

## 2020-04-02 MED ORDER — ONDANSETRON HCL 4 MG PO TABS: 4.0000 mg | ORAL_TABLET | ORAL | Status: DC | PRN

## 2020-04-02 MED ORDER — DIPHENHYDRAMINE HCL 25 MG PO CAPS: 25.0000 mg | ORAL_CAPSULE | Freq: Four times a day (QID) | ORAL | Status: DC | PRN

## 2020-04-02 MED ORDER — PRENATAL MULTIVITAMIN CH
1.0000 | ORAL_TABLET | Freq: Every day | ORAL | Status: DC
  Filled 2020-04-02: qty 1

## 2020-04-02 MED ORDER — OXYCODONE-ACETAMINOPHEN 5-325 MG PO TABS: 1.0000 | ORAL_TABLET | ORAL | Status: DC | PRN

## 2020-04-02 MED ORDER — BENZOCAINE-MENTHOL 20-0.5 % EX AERO
1.0000 "application " | INHALATION_SPRAY | CUTANEOUS | Status: DC | PRN
  Administered 2020-04-02: 1 via TOPICAL
  Filled 2020-04-02: qty 56

## 2020-04-02 MED ORDER — WITCH HAZEL-GLYCERIN EX PADS
1.0000 "application " | MEDICATED_PAD | CUTANEOUS | Status: DC | PRN
  Filled 2020-04-02: qty 100

## 2020-04-02 MED ORDER — ONDANSETRON HCL 4 MG/2ML IJ SOLN: 4.0000 mg | INTRAMUSCULAR | Status: DC | PRN

## 2020-04-02 MED ORDER — FLEET ENEMA 7-19 GM/118ML RE ENEM: 1.0000 | ENEMA | RECTAL | Status: DC | PRN

## 2020-04-02 MED ORDER — ACETAMINOPHEN 325 MG PO TABS: 650.0000 mg | ORAL_TABLET | ORAL | Status: DC | PRN

## 2020-04-02 MED ORDER — LIDOCAINE HCL (PF) 1 % IJ SOLN
30.0000 mL | INTRAMUSCULAR | Status: AC | PRN
  Administered 2020-04-02: 11:00:00 30 mL via SUBCUTANEOUS
  Filled 2020-04-02: qty 30

## 2020-04-02 MED ORDER — OXYTOCIN BOLUS FROM INFUSION
500.0000 mL | Freq: Once | INTRAVENOUS | Status: AC
  Administered 2020-04-02: 11:00:00 500 mL via INTRAVENOUS

## 2020-04-02 MED ORDER — LACTATED RINGERS IV SOLN: INTRAVENOUS | Status: DC

## 2020-04-02 MED ORDER — LACTATED RINGERS IV SOLN: 500.0000 mL | INTRAVENOUS | Status: DC | PRN

## 2020-04-02 MED ORDER — SIMETHICONE 80 MG PO CHEW: 80.0000 mg | CHEWABLE_TABLET | ORAL | Status: DC | PRN

## 2020-04-02 MED ORDER — IBUPROFEN 600 MG PO TABS
600.0000 mg | ORAL_TABLET | Freq: Four times a day (QID) | ORAL | Status: DC
  Filled 2020-04-02 (×2): qty 1

## 2020-04-02 MED ORDER — COCONUT OIL OIL: 1.0000 "application " | TOPICAL_OIL | Status: DC | PRN

## 2020-04-02 MED ORDER — SENNOSIDES-DOCUSATE SODIUM 8.6-50 MG PO TABS
2.0000 | ORAL_TABLET | ORAL | Status: DC
  Administered 2020-04-02: 2 via ORAL
  Filled 2020-04-02: qty 2

## 2020-04-02 MED ORDER — DIBUCAINE (PERIANAL) 1 % EX OINT: 1.0000 "application " | TOPICAL_OINTMENT | CUTANEOUS | Status: DC | PRN

## 2020-04-02 MED ORDER — SODIUM CHLORIDE 0.9 % IV SOLN
2.0000 g | Freq: Once | INTRAVENOUS | Status: AC
  Administered 2020-04-02: 09:00:00 2 g via INTRAVENOUS
  Filled 2020-04-02: qty 2000

## 2020-04-02 MED ORDER — OXYCODONE-ACETAMINOPHEN 5-325 MG PO TABS: 2.0000 | ORAL_TABLET | ORAL | Status: DC | PRN

## 2020-04-02 MED ORDER — BETAMETHASONE SOD PHOS & ACET 6 (3-3) MG/ML IJ SUSP
12.0000 mg | Freq: Once | INTRAMUSCULAR | Status: AC
  Administered 2020-04-02: 12 mg via INTRAMUSCULAR
  Filled 2020-04-02: qty 5

## 2020-04-02 MED ORDER — TETANUS-DIPHTH-ACELL PERTUSSIS 5-2.5-18.5 LF-MCG/0.5 IM SUSP: 0.5000 mL | Freq: Once | INTRAMUSCULAR | Status: DC

## 2020-04-02 MED ORDER — OXYTOCIN 40 UNITS IN NORMAL SALINE INFUSION - SIMPLE MED
2.5000 [IU]/h | INTRAVENOUS | Status: DC
  Filled 2020-04-02: qty 1000

## 2020-04-02 MED ORDER — SOD CITRATE-CITRIC ACID 500-334 MG/5ML PO SOLN: 30.0000 mL | ORAL | Status: DC | PRN

## 2020-04-02 MED ORDER — ZOLPIDEM TARTRATE 5 MG PO TABS: 5.0000 mg | ORAL_TABLET | Freq: Every evening | ORAL | Status: DC | PRN

## 2020-04-02 NOTE — Lactation Note (Signed)
This note was copied from a baby's chart. Lactation Consultation Note  Patient Name: Yesenia Haney [id number] Date: 04/02/2020 Reason for consult: Initial assessment;Late-preterm 34-36.6wks    P3 mom with 48wk1 d old infant.  Not cueing but mom desires to attempt bf and would like for [facility] assistance.  Mom has infant STS.  Mom is able to hand exp and 1 very drops placed to infants lips with LC's gloved finger.    Infant did not open to suck.  LC attempted to burp infant.  Infant burped 3 times and mom attempted latch in cross cradle on right side.  Infant did not open mouth to bf.    LC reviewed LPTI guidelines.  Mom has pumped one time and LC encouraged her to continue to pump and then hand express and collect EBM afterwards.  Colostrum containers provided.     Encouraged mom to pump every 2-3 hours.  Goal is to pump 8 times in 24 hours.  Mom understands the goal is to stimulate milk supply and collect any EBM to feed to infant.    Mom and dad are aware of infant needing to eat at least every 3 hours or earlier when cueing.  Mom will attempt to bf then supplement with EBM or Donor breastmilk.  LC urged family to call RN if infant shows no interest in two feedings in a row.  All questions answered.  Parents are aware of OP services and BFSG available as well as phone line.    LC reviewed proper flange fit information and mom knows to call out if concerns arise about pumping or if discomfort is felt when pumping.    Maternal Data Has patient been taught Hand Expression?: Yes Does the patient have breastfeeding experience prior to this delivery?: Yes  Feeding Feeding Type: Breast Fed Nipple Type: Extra Slow Flow  LATCH Score Latch: Too sleepy or reluctant, no latch achieved, no sucking elicited.  Audible Swallowing: None  Type of Nipple: Everted at rest and after stimulation  Comfort (Breast/Nipple): Soft / non-tender  Hold (Positioning): Assistance needed to correctly  position infant at breast and maintain latch.  LATCH Score: 5  Interventions Interventions: Breast feeding basics reviewed;Assisted with latch;Skin to skin;Breast massage;Hand express;Position options;Expressed milk;Support pillows;DEBP  Lactation Tools Discussed/Used Pump Review: Setup, frequency, and cleaning Initiated by:: RN Date initiated:: 04/03/20   Consult Status Consult Status: Follow-up Date: 04/03/20 Follow-up type: In-patient    Maryruth Hancock [facility] 04/02/2020, 7:22 PM

## 2020-04-02 NOTE — MAU Note (Signed)
.   Yesenia Haney is a 39 y.o. at [redacted]w[redacted]d here in MAU reporting: contractions since 4am. Denies any VB or LOF.   Onset of complaint: 04/02/20 0430 Pain score:7 Vitals:   04/02/20 0803  BP: 127/74  Pulse: 82  Resp: 16  Temp: 98.3 F (36.8 C)  SpO2: 99%     FHT:135 Lab orders placed from triage: UA

## 2020-04-02 NOTE — H&P (Signed)
HPI: 39 y/o G3P2002 @ [redacted]w[redacted]d estimated gestational age (as dated by LMP c/w 20 week ultrasound) presents complaining of painful contractions since last night.   no Leaking of Fluid,   no Vaginal Bleeding,   + Uterine Contractions,  + Fetal Movement.  Prenatal care has been provided by Dr. Charlotta Newton  ROS: no HA, no epigastric pain, no visual changes.    Pregnancy complicated by: 1) AMA- elevated AFP (1in 10), [place] with normal findings Followed by [place] q 4wks- last [place] @ [redacted]w[redacted]d- vertex/sant/5#5oz  2) GBS positive- plan for Amp  Prenatal Transfer Tool  Maternal Diabetes: No Genetic Screening: Abnormal:  Results: Elevated AFP Maternal Ultrasounds/Referrals: Normal Fetal Ultrasounds or other Referrals:  Referred to Materal Fetal Medicine  Maternal Substance Abuse:  No Significant Maternal Medications:  None Significant Maternal Lab Results: Group B Strep positive   PNL:  GBS positive, Rub Immune, Hep B neg, RPR NR, HIV neg, GC/C neg, glucola:113 Hgb: 11.5 Blood type: O positive, antibody neg  Immunizations: Tdap: 3/18  OBHx: 2017- primary C-section BREECH 2019- VBAC, 6#10oz uncomplicated PMHx:  none Meds:  PNV Allergy:   Allergies  Allergen Reactions  . Bee Venom Itching, Nausea And Vomiting and Swelling    Flu-like symptoms   SurgHx: C-section x 1 SocHx:   no Tobacco, no  EtOH, no Illicit Drugs  O: BP 127/74   Pulse 82   Temp 98.3 F (36.8 C)   Resp 16   Ht 5\' 9"  (1.753 m)   Wt 78.5 kg   LMP 07/31/2019 (Exact Date)   SpO2 99%   BMI 25.55 kg/m    Gen. AAOx3, NAD CV.  RRR  No murmur.  Resp. Normal respiratory rate and effort Abd. Gravid,  no tenderness,  no rigidity,  no guarding Extr.  no edema B/L , no calf tenderness  FHT: 130 baseline, moderate variability, + accels,  no decels Toco: q 3-5 min SVE: deferred- per MAU 5.5cm    Labs: see orders  A/P:  39 y.o. G3P2002 @ [redacted]w[redacted]d EGA who presents for labor -FWB:  NICHD Cat I FHTs -Labor: expectant management -GBS:  positive- Amp for labor  -Preterm labor: BMZ to be given -CBC, T&S to be collected -TOLAC desired- prior VBAC- risk/benefit and alternatives reviewed- consent signed  [redacted]w[redacted]d, DO [phone] (cell) [phone] (office)

## 2020-04-02 NOTE — MAU Provider Note (Signed)
Chief Complaint:  Contractions   First Provider Initiated Contact with Patient 04/02/20 [phone]     HPI: Yesenia Haney is a 39 y.o. G3P2002 at [redacted]w[redacted]d who presents to maternity admissions reporting contractions. Started this morning around 430 am. Denies vaginal bleeding, LOF, fever, or recent intercourse. Good fetal movement.  Location: abdomen Quality: contractions Severity: 7/10 in pain scale Duration: 4 hours Timing: intermittent Modifying factors: none Associated signs and symptoms: none  Pregnancy Course: Dr. Charlotta Newton at [place] [facility]. G1=term c/section for breech, G2=term VBAC. Elevated AFP  Past Medical History:  Diagnosis Date  . Medical history non-contributory    OB History  Gravida Para Term Preterm AB Living  3 2 2     2   SAB TAB Ectopic Multiple Live Births        0 2    # Outcome Date GA Lbr Len/2nd Weight Sex Delivery Anes PTL Lv  3 Current           2 Term 02/24/18 [redacted]w[redacted]d 14:26 / 00:32 3164 g F Vag-Spont None  LIV  1 Term 01/28/16 [redacted]w[redacted]d  3625 g M CS-LTranv Spinal  LIV   Past Surgical History:  Procedure Laterality Date  . CESAREAN SECTION N/A 01/28/2016   Procedure: CESAREAN SECTION;  Surgeon: 01/30/2016, DO;  Location: [facility];  Service: Obstetrics;  Laterality: N/A;   Family History  Problem Relation Age of Onset  . Breast cancer Mother    Social History   Tobacco Use  . Smoking status: Never Smoker  . Smokeless tobacco: Never Used  Substance Use Topics  . Alcohol use: Not Currently  . Drug use: No   Allergies  Allergen Reactions  . Bee Venom Itching, Nausea And Vomiting and Swelling    Flu-like symptoms   Medications Prior to Admission  Medication Sig Dispense Refill Last Dose  . calcium carbonate (TUMS - DOSED IN MG ELEMENTAL CALCIUM) 500 MG chewable tablet Chew 1 tablet by mouth 2 (two) times daily as needed for indigestion or heartburn.   Past Month at Unknown time  . Prenatal Vit-Fe Fumarate-FA (PRENATAL MULTIVITAMIN) TABS tablet Take 1 tablet  by mouth daily at 12 noon.   04/02/2020 at Unknown time  . ibuprofen (ADVIL,MOTRIN) 600 MG tablet Take 1 tablet (600 mg total) by mouth every 6 (six) hours as needed for mild pain or moderate pain. (Patient not taking: Reported on 12/19/2019) 30 tablet 1   . senna-docusate (SENOKOT-S) 8.6-50 MG tablet Take 2 tablets by mouth daily. As needed for constipation (Patient not taking: Reported on 02/24/2018) 30 tablet 1     I have reviewed patient's Past Medical Hx, Surgical Hx, Family Hx, Social Hx, medications and allergies.   ROS:  Review of Systems  Constitutional: Negative.   Gastrointestinal: Positive for abdominal pain. Negative for nausea and vomiting.  Genitourinary: Negative.     Physical Exam   Patient Vitals for the past 24 hrs:  BP Temp Pulse Resp SpO2 Height Weight  04/02/20 0803 127/74 98.3 F (36.8 C) 82 16 99 % 5\' 9"  (1.753 m) 78.5 kg    Constitutional: Well-developed, well-nourished female in no acute distress.  Cardiovascular: normal rate & rhythm, no murmur Respiratory: normal effort, lung sounds clear throughout GI: Abd soft, non-tender, gravid appropriate for gestational age. Pos BS x 4 MS: Extremities nontender, no edema, normal ROM Neurologic: Alert and oriented x 4.  GU:    Dilation: 5.5 Effacement (%): 90 Cervical Position: Middle Station: -2 Presentation: Vertex Exam by:: NP  NST:  Baseline: 135 bpm, Variability: Good {> 6 bpm), Accelerations: Reactive, Decelerations: Absent and ctz Q3-5 minutes   Labs: No results found for this or any previous visit (from the past 24 hour(s)).  Imaging:  No results found.  MAU Course: Orders Placed This Encounter  Procedures  . Respiratory Panel by RT PCR (Flu A&B, Covid) - Nasopharyngeal Swab  . Urinalysis, Routine w reflex microscopic   No orders of the defined types were placed in this encounter.   MDM: Patient present with regular painful contractions. Cervix 5.5/90/BBOW, vertex.   Drs. Lockhart aware of patient. Admission orders placed. IV ampicillin for unknown GBS status in preterm labor -- will collect GBS culture as well. BMZ given in MAU.   Assessment: 1. Preterm uterine contractions in third trimester, antepartum   2. [redacted] weeks gestation of pregnancy   3. Premature cervical dilation in third trimester     Plan: Admit to birthing suites  Jorje Guild, NP 04/02/2020 8:22 AM

## 2020-04-03 LAB — CBC
HCT: 35.1 % — ABNORMAL LOW (ref 36.0–46.0)
Hemoglobin: 11.9 g/dL — ABNORMAL LOW (ref 12.0–15.0)
MCH: 31.8 pg (ref 26.0–34.0)
MCHC: 33.9 g/dL (ref 30.0–36.0)
MCV: 93.9 fL (ref 80.0–100.0)
Platelets: 211 10*3/uL (ref 150–400)
RBC: 3.74 MIL/uL — ABNORMAL LOW (ref 3.87–5.11)
RDW: 12.4 % (ref 11.5–15.5)
WBC: 22.2 10*3/uL — ABNORMAL HIGH (ref 4.0–10.5)
nRBC: 0 % (ref 0.0–0.2)

## 2020-04-03 NOTE — Progress Notes (Signed)
Postpartum Note Day # 1  S:  Patient resting comfortable in bed.  Pain controlled.  Tolerating general diet. + flatus, no BM.  Lochia minimal.  Ambulating without difficulty.  She denies n/v/f/c, SOB, or CP.  Pt plans on breastfeeding.  O: Temp:  [97.5 F (36.4 C)-98.6 F (37 C)] 98.1 F (36.7 C) (04/22 0546) Pulse Rate:  [56-125] 56 (04/22 0546) Resp:  [16-20] 16 (04/22 0546) BP: (89-127)/(54-74) 103/69 (04/22 0546) SpO2:  [99 %-100 %] 100 % (04/22 0236) Weight:  [78.5 kg] 78.5 kg (04/21 0803)   Gen: A&Ox3, NAD CV: RRR, no MRG Resp: CTAB Abdomen: soft, NT, ND Uterus: firm, non-tender, below umbilicus Ext: No edema, no calf tenderness bilaterally, SCDs in place  Labs:  Recent Labs    04/02/20 0814 04/03/20 0505  HGB 12.5 11.9*    A/P: Pt is a 39 y.o. [id number] s/p NSVD, PPD#1  - Pain well controlled -GU: voiding freely -GI: Tolerating general diet -Activity: encouraged sitting up to chair and ambulation as tolerated -Prophylaxis: early ambulation -Labs: stable as above -Baby boy in NICU  DISPO: Continue with routine postpartum care  Myna Hidalgo, DO [phone] (cell) [phone] (office)

## 2020-04-03 NOTE — Progress Notes (Signed)
CSW looked for parents at bedside to offer support and assess for needs, concerns, and resources; they were not present at this time.  If CSW does not see parents face to face tomorrow, CSW will call to check in. °  °CSW spoke with bedside nurse and no psychosocial stressors were identified.  °  °CSW will continue to offer support and resources to family while infant remains in NICU.  °  °Khayree Delellis, LCSW °Clinical Social Worker °[facility] °Cell#: [phone] ° ° ° °

## 2020-04-03 NOTE — Lactation Note (Signed)
This note was copied from a baby's chart. Lactation Consultation Note  Patient Name: Yesenia Haney [id number] Date: 04/03/2020   P3, Baby in NICU.  CGA [redacted]w[redacted]d.  < 6 lbs. Mother is pumping and hand expressing and will bring small volume to NICU and ask for labels. She has a DEBP at home but does not remember brand. Encouraged mother to keep hand expressing before and after pumping. Discussed pumping frequency and referred mother to NICU booklet.  Suggest bring pump parts to NICU and pumping at bedside.      Maternal Data    Feeding    LATCH Score                   Interventions    Lactation Tools Discussed/Used     Consult Status      Hardie Pulley 04/03/2020, 10:52 AM

## 2020-04-03 NOTE — Progress Notes (Signed)
Patient ambulated to NICU to visit infant.

## 2020-04-04 LAB — CULTURE, BETA STREP (GROUP B ONLY)

## 2020-04-04 LAB — SURGICAL PATHOLOGY

## 2020-04-04 MED ORDER — IBUPROFEN 600 MG PO TABS: 600.0000 mg | ORAL_TABLET | Freq: Four times a day (QID) | ORAL | 0 refills | Status: AC

## 2020-04-04 NOTE — Discharge Instructions (Signed)

## 2020-04-04 NOTE — Discharge Summary (Signed)
Postpartum Discharge Summary    Patient Name: Yesenia Haney DOB: 10-Oct-1981 MRN: [id number]  Date of admission: 04/02/2020 Delivering Provider: Janyth Pupa   Date of discharge: 04/04/2020  Admitting diagnosis: Indication for care in labor and delivery, antepartum [O75.9] Intrauterine pregnancy: [redacted]w[redacted]d    Secondary diagnosis:  Active Problems:   Indication for care in labor and delivery, antepartum  Additional problems: Preterm labor     Discharge diagnosis: Preterm Pregnancy Delivered                                                                                                Post partum procedures:none  Augmentation: AROM  Complications: None  Hospital course:  Onset of Labor With Vaginal Delivery     39y.o. yo [id number] 40w1das admitted in Active Labor on 04/02/2020. Patient had an uncomplicated labor course as follows:  Membrane Rupture Time/Date: 10:53 AM ,04/02/2020   Intrapartum Procedures: Episiotomy: None [1]                                         Lacerations:    Patient had a delivery of a Viable infant. 04/02/2020  Information for the patient's newborn:  ViRylee, Huestis0[[id number]]Delivery Method: VBAC     Pateint had an uncomplicated postpartum course.  She is ambulating, tolerating a regular diet, passing flatus, and urinating well. Patient is discharged home in stable condition on 04/04/20.  Delivery time: 11:14 AM    Magnesium Sulfate received: No BMZ received: Yes Rhophylac:No MMR:N/A Transfusion:No  Physical exam  Vitals:   04/02/20 2210 04/03/20 0236 04/03/20 0546 04/04/20 0607  BP: 123/74 119/71 103/69 114/68  Pulse: 75 82 (!) 56 (!) 55  Resp: '16 16 16 16  '$ Temp: 98.6 F (37 C) 98.5 F (36.9 C) 98.1 F (36.7 C) 97.8 F (36.6 C)  TempSrc: Oral Oral Oral Oral  SpO2:  100%    Weight:      Height:       General: alert, cooperative and no distress Lochia: appropriate Uterine Fundus: firm Incision: N/A DVT Evaluation: No  evidence of DVT seen on physical exam. Labs: Lab Results  Component Value Date   WBC 22.2 (H) 04/03/2020   HGB 11.9 (L) 04/03/2020   HCT 35.1 (L) 04/03/2020   MCV 93.9 04/03/2020   PLT 211 04/03/2020   No flowsheet data found. Edinburgh Score: Edinburgh Postnatal Depression Scale Screening Tool 04/02/2020  I have been able to laugh and see the funny side of things. 0  I have looked forward with enjoyment to things. 0  I have blamed myself unnecessarily when things went wrong. 1  I have been anxious or worried for no good reason. 2  I have felt scared or panicky for no good reason. 1  Things have been getting on top of me. 1  I have been so unhappy that I have had difficulty sleeping. 1  I have felt sad or miserable. 1  I have been so unhappy  that I have been crying. 1  The thought of harming myself has occurred to me. 0  Edinburgh Postnatal Depression Scale Total 8    Discharge instruction: per After Visit Summary and "Baby and Me Booklet".  After visit meds:  Allergies as of 04/04/2020      Reactions   Bee Venom Itching, Nausea And Vomiting, Swelling   Flu-like symptoms      Medication List    TAKE these medications   calcium carbonate 500 MG chewable tablet Commonly known as: TUMS - dosed in mg elemental calcium Chew 1 tablet by mouth 2 (two) times daily as needed for indigestion or heartburn.   ibuprofen 600 MG tablet Commonly known as: ADVIL Take 1 tablet (600 mg total) by mouth every 6 (six) hours. What changed:   when to take this  reasons to take this   prenatal multivitamin Tabs tablet Take 1 tablet by mouth daily at 12 noon.   senna-docusate 8.6-50 MG tablet Commonly known as: Senokot-S Take 2 tablets by mouth daily. As needed for constipation       Diet: routine diet  Activity: Advance as tolerated. Pelvic rest for 6 weeks.   Outpatient follow up:6 weeks Follow up Appt:No future appointments. Follow up Visit: Follow-up Information    Janyth Pupa, DO Follow up in 6 week(s).   Specialty: Obstetrics and Gynecology Contact information: [address] [place] [address] [phone]           Newborn Data: Live born female  Birth Weight: 5 lb 11 oz (2580 g) APGAR: 9, 9  Newborn Delivery   Birth date/time: 04/02/2020 11:14:00 Delivery type: VBAC, Spontaneous     Contraception: pills Baby Feeding: in NICU Disposition:NICU   04/04/2020 Annalee Genta, DO

## 2020-04-08 ENCOUNTER — Ambulatory Visit: Payer: Self-pay

## 2020-04-08 NOTE — Lactation Note (Signed)
This note was copied from a baby's chart. Lactation Consultation Note  Patient Name: Yesenia Haney OEVOJ'J Date: 04/08/2020 Reason for consult: Follow-up assessment;NICU baby;Infant < 6lbs;Late-preterm 34-36.6wks  Baby's RN called for LC assist.  6 day old baby AGA [redacted]w[redacted]d, rooting around and showing interest in po feeding.  Baby has been NG fed exclusively.  Mom holding baby in football hold.  Asked RN if baby could be STS, she checked baby's temp and unwrapped baby while Mom took her arm out of her shirt.  Added pillow to support baby higher at breast level.  Mom has short nipples and compressible areola.  Mom had just pumped about 30 mins before.  Milk easily hand expressed.  Baby tried repeatedly to latch but quickly slips to nipple tip.  Initiated a 20 mm nipple shield, showing Mom how to properly apply, and support her breast at base of breast.  Nipple pulled well into shield.  Baby able to attain a deeper latch, uncurled lower lips and he sucked for a few minutes.  One swallow identified.  Mom feeling a little tug.  Baby fell asleep repeatedly, taken off breast and then relatched and sucked nicely for a few minutes again.  Mom taught to use breast compression baby's sucking.  When baby encouraged to latch again, he brought his hands up and stiffened.  I told Mom that baby was telling us he is done for now.   Nipple noted to be pulled further into shield when baby came off.  Reassured Mom that baby would get stronger with each day and encouraged her to do STS, and offer breast with cues.   Mom aware she can request lactation assistance prn by letting her baby's RN know.  Feeding Feeding Type: Breast Fed  LATCH Score Latch: Repeated attempts needed to sustain latch, nipple held in mouth throughout feeding, stimulation needed to elicit sucking reflex.  Audible Swallowing: A few with stimulation  Type of Nipple: Everted at rest and after stimulation  Comfort (Breast/Nipple): Soft /  non-tender  Hold (Positioning): Assistance needed to correctly position infant at breast and maintain latch.  LATCH Score: 7  Interventions Interventions: Breast feeding basics reviewed;Assisted with latch;Skin to skin;Breast massage;Breast compression;Adjust position;Pre-pump if needed;Support pillows;Position options;DEBP  Lactation Tools Discussed/Used Tools: Pump;Nipple Shields Nipple shield size: 20 Breast pump type: Double-Electric Breast Pump   Consult Status Consult Status: Follow-up Date: 04/10/20 Follow-up type: In-patient    Judee Clara 04/08/2020, 4:49 PM

## 2020-04-14 ENCOUNTER — Ambulatory Visit: Payer: Self-pay

## 2020-04-14 NOTE — Lactation Note (Signed)
This note was copied from a baby's chart. Lactation Consultation Note  Patient Name: Yesenia Haney OEVOJ'J Date: 04/14/2020 Reason for consult: Follow-up assessment;Late-preterm 34-36.6wks;NICU baby  P3 mother whose infant is now 70 days old.  This is a LPTI born at 35+1 weeks with a CGA of 36+6 weeks and in the NICU.  Mother requested a lactation assist today at 1400.  RN in room assessing baby and changing diaper when I arrived.  Mother was pumping prior to latching to help ease the flow of her milk during latching.  She usually obtains approximately 90 mls/breast each session.    Mother was interested in using the cross cradle hole.  She has her own breast feeding pillow from home.  Assisted her to lay another pillow on top for better infant positioning.  Mother's breasts are soft and non tender and nipples are short shafted and everted.  Mother placed the NS correctly on her breast.  I observed her attempting to latch and made a few suggestions during the latching process.  Baby was very sleepy.  I offered to awaken him and set him upright.  Gentle stimulation performed and suck training initiated to help arouse him.  He awakened, looked up at his mother and still appeared very sleepy.  Attempted to assist her and latched in the cross cradle position on the left breast.  Once at the breast, baby had flanged lips and a wide gape.  He showed no interest in initiating a suck.  Again, stimulation provided but he remained sleepy.  Worked with him for a total of 10 minutes before suggesting we stop trying.  Pulled him off the breast and he was asleep.  Had mother observe his behavior and his lack of interest at this feeding.  Suggested he lay STS on mother's chest and RN could finish his gavage feeding.  Praised mother for her efforts but reminded her that he will determine when he is ready to latch.  His gestational age prevents him from latching well with every attempt. Discussed the importance of  attempting but not stressing him with learning to breast feed.  Mother verbalized understanding.    She will continue to assess his readiness and will call for future help with feeding. RN updated.    Maternal Data Formula Feeding for Exclusion: No  Feeding Feeding Type: Breast Fed Nipple Type: Nfant Extra Slow Flow (gold)  LATCH Score Latch: Too sleepy or reluctant, no latch achieved, no sucking elicited.  Audible Swallowing: None  Type of Nipple: Everted at rest and after stimulation(short shafted)  Comfort (Breast/Nipple): Soft / non-tender  Hold (Positioning): Assistance needed to correctly position infant at breast and maintain latch.  LATCH Score: 5  Interventions Interventions: Breast feeding basics reviewed;Assisted with latch;Skin to skin;Hand express;Breast compression;Adjust position;DEBP;Position options;Support pillows;Expressed milk  Lactation Tools Discussed/Used Nipple shield size: 20 Breast pump type: Double-Electric Breast Pump   Consult Status Consult Status: PRN Date: 04/14/20 Follow-up type: Call as needed    Rania Prothero R Matsue Strom 04/14/2020, 2:17 PM

## 2020-05-06 ENCOUNTER — Inpatient Hospital Stay (HOSPITAL_COMMUNITY): Admit: 2020-05-06 | Payer: Self-pay

## 2021-05-10 IMAGING — US US MFM OB DETAIL+14 WK
1 series · 12 of 28 positions shown · non-contrast
Comparison: none

[Series 1: us mfm ob detail+14 wk · 122 acquisitions, 12 frames shown]
[im 5/122]
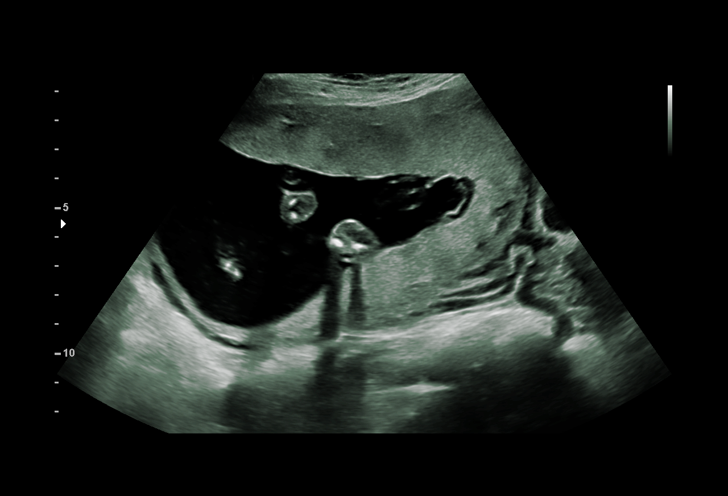
[im 14/122]
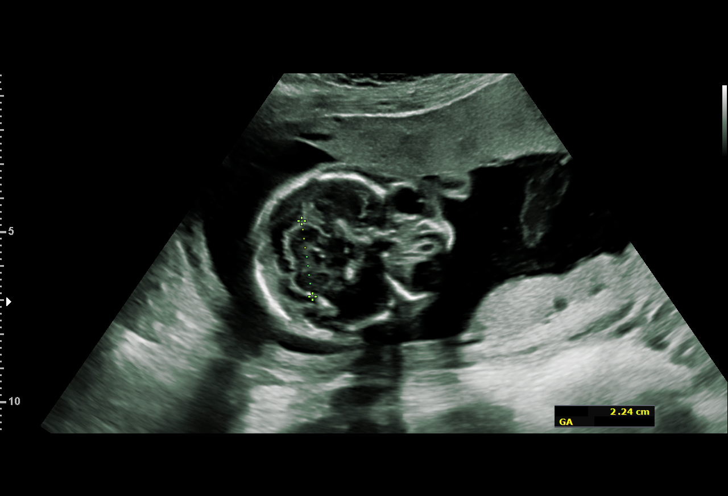
[im 23/122]
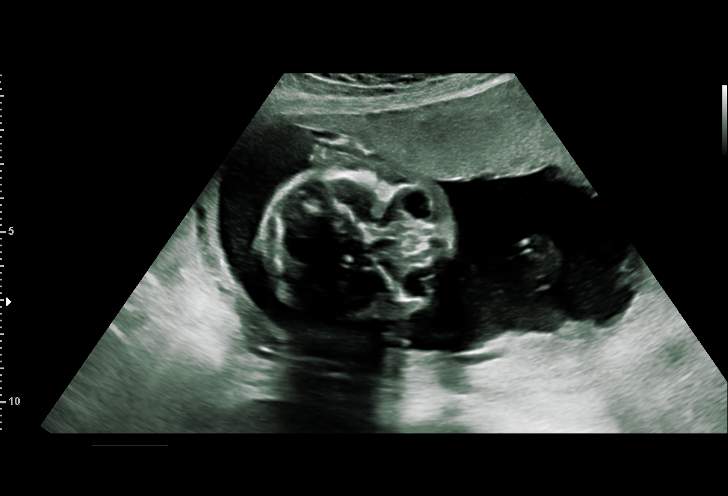
[im 36/122]
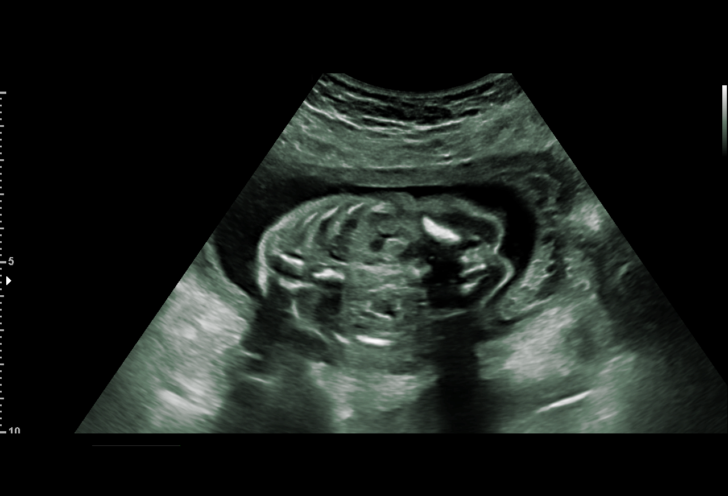
[im 45/122]
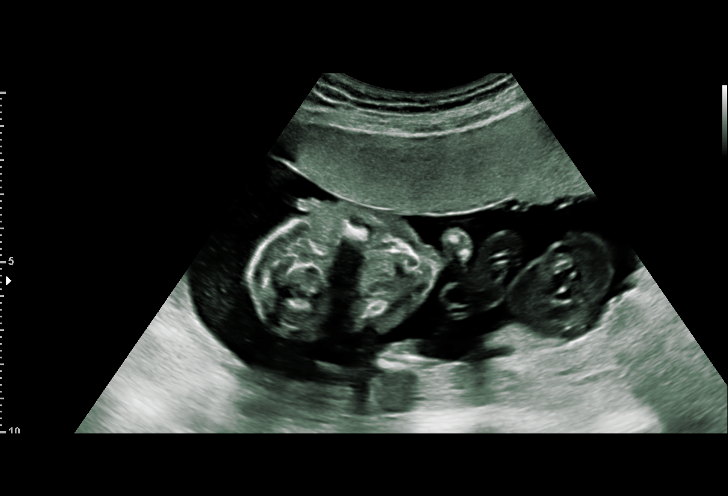
[im 54/122]
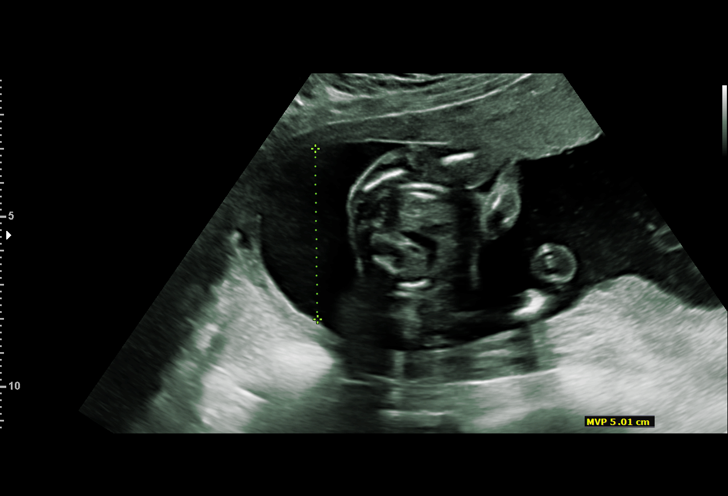
[im 68/122]
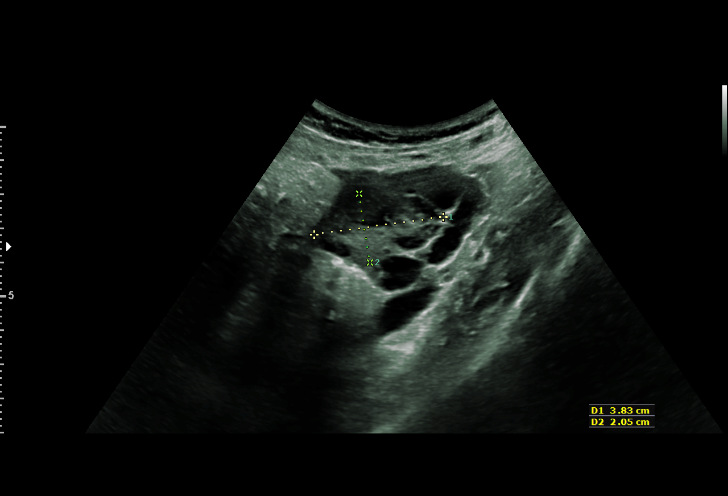
[im 77/122]
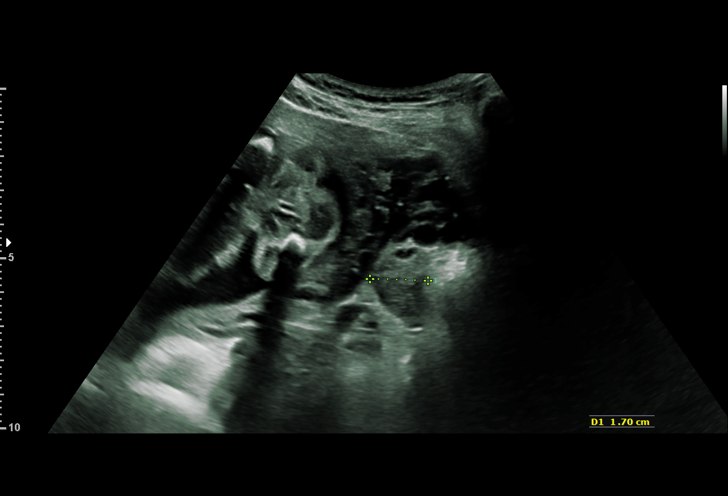
[im 86/122]
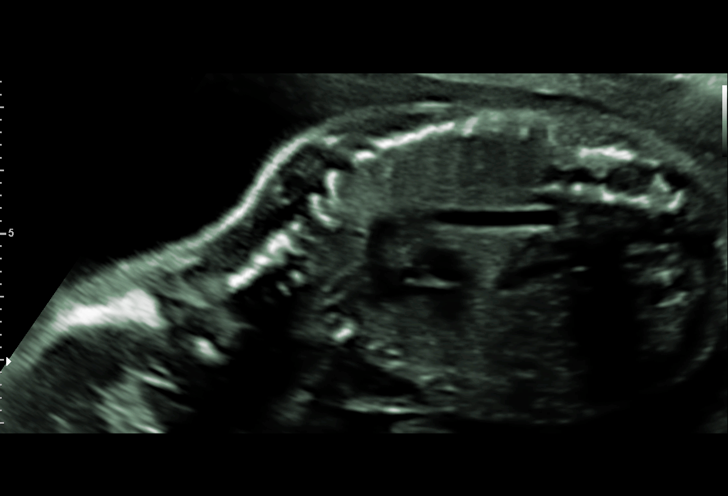
[im 99/122]
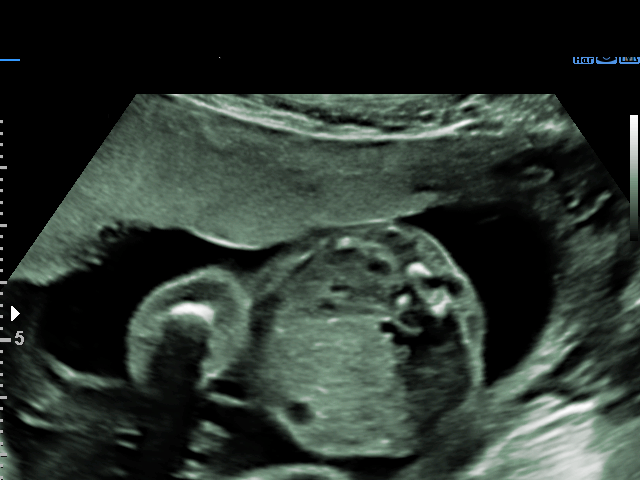
[im 108/122]
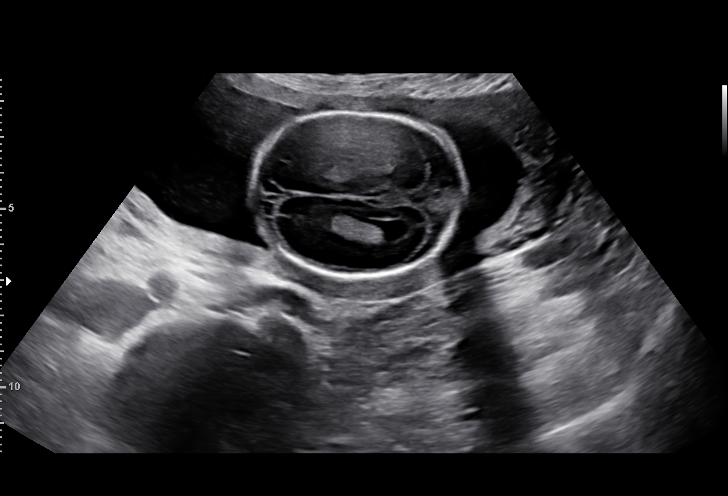
[im 117/122]
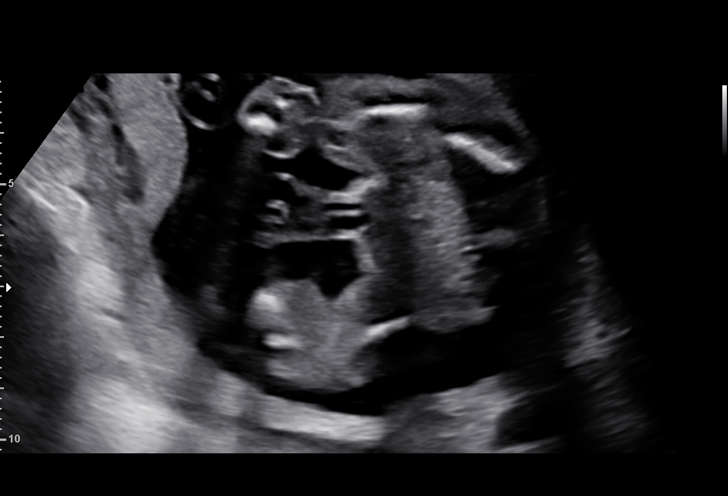

[12 of 28 positions shown; findings below may reference images not displayed]

----------------------------------------------------------------------

 ----------------------------------------------------------------------
Indications

  Abnormal biochemical finding on antenatal
  screening of mother  MSAFP  6.32 uE3
  20 weeks gestation of pregnancy
  Advanced maternal age multigravida 34+,
  second trimester
  History of cesarean delivery, currently
  pregnant  VBAC w/2nd pregnancy
  Encounter for antenatal screening for
  malformations
 ----------------------------------------------------------------------
Fetal Evaluation

 Num Of Fetuses:         1
 Fetal Heart Rate(bpm):  146
 Cardiac Activity:       Observed
 Presentation:           Breech
 Placenta:               Anterior
 P. Cord Insertion:      Visualized, central

 Amniotic Fluid
 AFI FV:      Within normal limits

                             Largest Pocket(cm)

Biometry

 BPD:      49.6  mm     G. Age:  21w 0d         82  %    CI:        73.24   %    70 - 86
                                                         FL/HC:      19.1   %    16.8 -
 HC:      184.2  mm     G. Age:  20w 5d         71  %    HC/AC:      1.12        1.09 -
 AC:       164   mm     G. Age:  21w 3d         84  %    FL/BPD:     70.8   %
 FL:       35.1  mm     G. Age:  21w 1d         75  %    FL/AC:      21.4   %    20 - 24
 HUM:      33.7  mm     G. Age:  21w 3d         87  %
 CER:      22.4  mm     G. Age:  21w 1d         69  %

 LV:        6.4  mm
 CM:        5.2  mm

 Est. FW:     408  gm    0 lb 14 oz      94  %
OB History

 Gravidity:    3         Term:   2
 Living:       2
Gestational Age

 Clinical EDD:  20w 1d                                        EDD:   05/06/20
 U/S Today:     21w 1d                                        EDD:   04/29/20
 Best:          20w 1d     Det. By:  Clinical EDD             EDD:   05/06/20
Anatomy

 Cranium:               Appears normal         LVOT:                   Appears normal
 Cavum:                 Appears normal         Aortic Arch:            Appears normal
 Ventricles:            Appears normal         Ductal Arch:            Appears normal
 Choroid Plexus:        Appears normal         Diaphragm:              Appears normal
 Cerebellum:            Appears normal         Stomach:                Appears normal, left
                                                                       sided
 Posterior Fossa:       Appears normal         Abdomen:                Appears normal
 Nuchal Fold:           Appears normal         Abdominal Wall:         Appears nml (cord
                                                                       insert, abd wall)
 Face:                  Appears normal         Cord Vessels:           Appears normal (3
                        (orbits and profile)                           vessel cord)
 Lips:                  Appears normal         Kidneys:                Appear normal
 Palate:                Appears normal         Bladder:                Appears normal
 Thoracic:              Appears normal         Spine:                  Appears normal
 Heart:                 Appears normal         Upper Extremities:      Appears normal
                        (4CH, axis, and
                        situs)
 RVOT:                  Appears normal         Lower Extremities:      Appears normal

 Other:  Parents do not wish to know sex of fetus. Nasal bone visualized.
         Heels and 5th digit visualized.
Cervix Uterus Adnexa

 Cervix
 Length:           3.97  cm.
 Normal appearance by transabdominal scan.

 Uterus
 No abnormality visualized.

 Left Ovary
 Within normal limits.

 Right Ovary
 Within normal limits.

 Cul De Sac
 No free fluid seen.
 Adnexa
 No adnexal mass visualized.
Comments

 This patient was seen in consultation due to an elevated
 MSAFP of 6.32 MoM and due to advanced maternal age.
 She reports no complications in her current pregnancy.  She
 denies any significant past medical or family history.
 Her quad screen indicated a low risk for trisomy 21 and 18.
 The patient was advised of the ultrasound findings that failed
 to reveal an anatomical cause for the increased MSAFP.
 There were no sonographic signs of spina bifida or an
 anterior abdominal wall defect noted today.  She was advised
 regarding the limitations of ultrasound in the detection of all
 anomalies and that it will diagnose approximately 90% of
 neural tube defects.  The association of an elevated MSAFP
 with placental dysfunction which may manifest later in her
 pregnancy as fetal growth restriction was also discussed.
 Due to the elevated MSAFP and advanced maternal age, the
 patient was offered and declined an amniocentesis today for
 definitive diagnosis of fetal aneuploidy and spina bifida.
 Due to the elevated MSAFP, she should continue to be
 followed with growth ultrasounds every 4 to 5 weeks.  These
 ultrasounds may be performed in your office or ours.  Weekly
 fetal testing should be started at around 32 weeks and
 continued until delivery.  Due to the severely elevated
 MSAFP level, she should probably be delivered at around 39
 weeks.
 No further exams were scheduled in our office.  We would be
 happy to see her in the future if necessary.
 A total of 30 minutes was spent counseling and coordinating
 the care for this patient.  Greater than 50% of the time was
 spent in direct face-to-face contact.
